# Patient Record
Sex: Female | Born: 2000 | Race: White | Hispanic: No | Marital: Single | Smoking: Never smoker
Health system: Southern US, Community
[De-identification: ages and names within clinical notes are randomized; demographics above are authoritative.]

## PROBLEM LIST (undated history)

## (undated) DIAGNOSIS — J45909 Unspecified asthma, uncomplicated: Secondary | ICD-10-CM

## (undated) DIAGNOSIS — I639 Cerebral infarction, unspecified: Secondary | ICD-10-CM

---
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
## 2022-01-11 ENCOUNTER — Encounter: Payer: Self-pay | Admitting: Occupational Therapy

## 2022-01-11 ENCOUNTER — Ambulatory Visit: Payer: Medicaid Other | Attending: Pediatrics | Admitting: Physical Therapy

## 2022-01-11 ENCOUNTER — Ambulatory Visit: Payer: Medicaid Other | Admitting: Occupational Therapy

## 2022-01-11 DIAGNOSIS — Z7409 Other reduced mobility: Secondary | ICD-10-CM | POA: Diagnosis present

## 2022-01-11 DIAGNOSIS — R2681 Unsteadiness on feet: Secondary | ICD-10-CM | POA: Insufficient documentation

## 2022-01-11 DIAGNOSIS — R41840 Attention and concentration deficit: Secondary | ICD-10-CM | POA: Diagnosis present

## 2022-01-11 DIAGNOSIS — R41844 Frontal lobe and executive function deficit: Secondary | ICD-10-CM | POA: Diagnosis present

## 2022-01-11 DIAGNOSIS — M25611 Stiffness of right shoulder, not elsewhere classified: Secondary | ICD-10-CM | POA: Diagnosis present

## 2022-01-11 DIAGNOSIS — M6281 Muscle weakness (generalized): Secondary | ICD-10-CM | POA: Insufficient documentation

## 2022-01-11 DIAGNOSIS — M25631 Stiffness of right wrist, not elsewhere classified: Secondary | ICD-10-CM | POA: Diagnosis present

## 2022-01-11 DIAGNOSIS — I69151 Hemiplegia and hemiparesis following nontraumatic intracerebral hemorrhage affecting right dominant side: Secondary | ICD-10-CM

## 2022-01-11 NOTE — Patient Instructions (Signed)
SHOULDER: Flexion On Table    Place hands on table, elbows straight. Move hips away from body. Slide towel forward Press hands down into table. Hold __5_ seconds. _10__ reps per set, __1_ sets per day, _7__ days per week, or you can clasp hands and reach for floor in sitting  MP Extension (Passive)    Straighten knuckles of ___all___ fingers using other hand gently. Hold __5__ seconds. Repeat __10__ times. Do _2-3___ sessions per day.  Copyright  VHI. All rights reserved.   Copyright  VHI. All rights reserved.  Composite Extension (Passive)    Using other hand, straighten thumb completely at both joints. Hold _5___ seconds. Repeat __10__ times. Do __2__ sessions per day. Perform gently do not over stretch  Copyright  VHI. All rights reserved.   AROM: Elbow Flexion / Extension    With right hand palm up, gently bend elbow as far as possible assisting with other hand. Then straighten arm as far as possible. Repeat ___10_ times per set. Do ___1_ sets per session. Do __3__ sessions per day.  Copyright  VHI. All rights reserved.  Supination (Passive)    Keep elbow bent at right angle and held firmly at side. Use other hand to turn forearm until palm faces upward. Hold __5__ seconds. Repeat ___10_ times. Do ___2_ sessions per day.  Copyright  VHI. All rights reserved.

## 2022-01-11 NOTE — Therapy (Signed)
OUTPATIENT OCCUPATIONAL THERAPY NEURO EVALUATION  Patient Name: Savannah [name] MRN: [id number] DOB:02/10/2001, 21 y.o., female Today's Date: 01/13/2022  PCP: Dr. Everlene Other REFERRING PROVIDER: Dr. Artis Flock   OT End of Session - 01/13/22 1621     Number of Visits 1    Authorization Type Pediatric Traditional Medicaid    OT Start Time 0935    OT Stop Time 1015    OT Time Calculation (min) 40 min    Activity Tolerance Patient tolerated treatment well    Behavior During Therapy [facility] for tasks assessed/performed              Past Medical History:  Diagnosis Date   Asthma    Stroke [facility])    Past Surgical History:  Procedure Laterality Date   BRAIN SURGERY  02/20/2016   Patient Active Problem List   Diagnosis Date Noted   MDD (major depressive disorder), recurrent severe, without psychosis (HCC) 08/04/2019   Suicide ideation 08/04/2019   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 (HCC) 02/26/2018   Adjustment disorder 02/26/2018   Spasticity 04/15/2017   Impaired functional mobility, balance, gait, and endurance 07/08/2016   AVM (arteriovenous malformation) brain 05/27/2016   Expressive aphasia 03/26/2016   Hydrocephalus, acquired (HCC) 03/07/2016   Neurocognitive deficits 03/07/2016   Intraparenchymal hematoma of brain (HCC) 02/20/2016   Hypertension 02/20/2016   Bradycardia 02/20/2016   Midline shift of brain due to hematoma (HCC) 02/20/2016    ONSET DATE:12/07/21 REFERRING DIAG:  Diagnosis I69.151 (ICD-10-CM) - 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 (HCC)     THERAPY DIAG:  Muscle weakness (generalized) - Plan: Ot plan of care cert/re-cert  Unsteadiness on feet - Plan: Ot plan of care cert/re-cert  Hemiplegia and hemiparesis following nontraumatic intracerebral hemorrhage affecting right dominant side (HCC) - Plan: Ot plan of care cert/re-cert  Stiffness  of right wrist, not elsewhere classified - Plan: Ot plan of care cert/re-cert  Stiffness of right shoulder, not elsewhere classified - Plan: Ot plan of care cert/re-cert  Attention and concentration deficit - Plan: Ot plan of care cert/re-cert  Frontal lobe and executive function deficit - Plan: Ot plan of care cert/re-cert  Rationale for Evaluation and Treatment: Rehabilitation  SUBJECTIVE:   SUBJECTIVE STATEMENT: Pt reports hand is doing fine Pt accompanied by:  grandmother  PERTINENT HISTORY:Astria Mikel Pyon is a 21 y.o. 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 Yes: NPRS scale: 8/10 Pain location: R foot Pain description: aching Aggravating factors: walking Relieving factors: rest  FALLS: Has patient fallen in last 6 months? Yes. Number of falls 2  LIVING ENVIRONMENT: Lives with: lives on campus, handicapp apt.   PLOF: Independent with basic ADLs and Independent with community mobility without device  PATIENT GOALS: review HEP  OBJECTIVE:   HAND DOMINANCE: Left  ADLs: Overall ADLs: modified I with all basic ADLS Transfers/ambulation related to ADLs:   IADLs: Shopping: needs assist with transportation  Meal Prep: eats at cafeteria  Handwriting:  writes with left hand  MOBILITY STATUS: Independent and Hx of falls       UPPER EXTREMITY ROM:    Active ROM Right eval Left eval  Shoulder flexion 30*   Shoulder abduction    Shoulder adduction    Shoulder extension  Shoulder internal rotation    Shoulder external rotation    Elbow flexion 75*   Elbow extension full   Wrist flexion unable   Wrist extension unable   Wrist ulnar deviation    Wrist radial deviation    Wrist pronation WFLS from neutral   Wrist supination unable   P/ROM  at least 90* shoulder flexion, 120 P/ROM elbow flexion  P/ROM wrist flexion  WFLS, extension 75% finger due to spasticity Mantains fingers in flexion and thumb adducted at rest, able to achieve full passive finger ext.   HAND FUNCTION: No active movement R hand  SENSATION: Light touch: Impaired     MUSCLE TONE: RUE: Hypertonic  COGNITION: Overall cognitive status: Within functional limits for tasks assessed       TODAY'S TREATMENT:                                                                                                                              DATE: reviewed HEP for gentle stretching   PATIENT EDUCATION: Education details: RUE stretches- see pt instructions Person educated: Patient and Parent Education method: Explanation, Demonstration, Verbal cues, and Handouts Education comprehension: verbalized understanding, returned demonstration, and verbal cues required  HOME EXERCISE PROGRAM: RUE stretching   GOALS: n/a 1 x visit- no goals set as education was completed on day of evalution.  CLINICAL IMPRESSION: Patient is a  21 y.o. 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 Pt. 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 Pt was seen today for evalution and review of self ROM stretches. Pt has a resting hand splint which she did not bring with her but she reports that it still fits. Pt is attending college and she resides in a handicap accessible apt.  Marland Kitchen   PERFORMANCE DEFICITS: in functional skills including ADLs, IADLs, coordination, dexterity, sensation, tone, ROM, strength, flexibility, Fine motor control, Gross motor control,  mobility, balance, and UE functional use,   IMPAIRMENTS: are limiting patient from ADLs, IADLs, education, work, play, leisure, and social participation.   CO-MORBIDITIES: may have co-morbidities  that affects occupational performance. Patient will benefit from skilled OT to address above impairments and improve overall function.  MODIFICATION OR ASSISTANCE TO COMPLETE EVALUATION: No modification of tasks or assist necessary to complete an evaluation.  OT OCCUPATIONAL PROFILE AND HISTORY: Detailed assessment: Review of records and additional review of physical, cognitive, psychosocial history related to current functional performance.  CLINICAL DECISION MAKING: LOW - limited treatment options, no task modification necessary  REHAB POTENTIAL: Good  EVALUATION COMPLEXITY: Low    PLAN:  OT FREQUENCY: one time visit  OT DURATION: 8 weeks  PLANNED INTERVENTIONS:  self care/ADL training, neuromuscular re-education, passive range of motion, and patient/family education  RECOMMENDED OTHER SERVICES: PT  CONSULTED AND AGREED WITH PLAN OF CARE: Patient and family member/caregiver  PLAN FOR NEXT SESSION: d/c OT, 1x visit for eval and review of self ROM, per patient request. Pt reports UE is doing well.   Keigen Caddell, OT 01/13/2022, 4:22 PM

## 2022-01-11 NOTE — Therapy (Signed)
OUTPATIENT PHYSICAL THERAPY NEURO EVALUATION   Patient Name: Savannah [name] MRN: [id number] DOB:2001-01-22, 21 y.o., female Today's Date: 01/11/2022   PCP: Tracey Harries, MD REFERRING PROVIDER: Margurite Auerbach, MD   PT End of Session - 01/11/22 1019     Visit Number 1    Number of Visits 5   with eval   Date for PT Re-Evaluation 03/08/22   to allow for scheduling conflicts and time to get new AFO   Authorization Type Medicaid    Authorization Time Period --    Authorization - Visit Number 1    PT Start Time 1017    PT Stop Time 1058    PT Time Calculation (min) 41 min    Equipment Utilized During Treatment --    Activity Tolerance Patient tolerated treatment well    Behavior During Therapy [facility] for tasks assessed/performed             Past Medical History:  Diagnosis Date   Asthma    Stroke [facility])    Past Surgical History:  Procedure Laterality Date   BRAIN SURGERY  02/20/2016   Patient Active Problem List   Diagnosis Date Noted   MDD (major depressive disorder), recurrent severe, without psychosis (HCC) 08/04/2019   Suicide ideation 08/04/2019   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 (HCC) 02/26/2018   Adjustment disorder 02/26/2018   Spasticity 04/15/2017   Impaired functional mobility, balance, gait, and endurance 07/08/2016   AVM (arteriovenous malformation) brain 05/27/2016   Expressive aphasia 03/26/2016   Hydrocephalus, acquired (HCC) 03/07/2016   Neurocognitive deficits 03/07/2016   Intraparenchymal hematoma of brain (HCC) 02/20/2016   Hypertension 02/20/2016   Bradycardia 02/20/2016   Midline shift of brain due to hematoma (HCC) 02/20/2016    ONSET DATE: 12/07/2021  REFERRING DIAG: B35.329 (ICD-10-CM) - 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 (HCC)  THERAPY DIAG:  Muscle weakness (generalized)  Unsteadiness on feet  Impaired  functional mobility, balance, gait, and endurance  Rationale for Evaluation and Treatment: Rehabilitation  SUBJECTIVE:                                                                                                                                                                                             SUBJECTIVE STATEMENT: "[name]"  Pt reports that since starting college she has been walking a lot more and has noticed she is struggling more with her legs and like her R knee might give out at times. Pt reports her current RLE AFO is painful when she is walking and she has to  remove it every time she sits down due to the pain. Pt reports she feels the pain from the lateral aspect of her AFO pressing against her ankle and feels like her foot might be turning in when walking and her toes might be curling. Pt also reports when she firsts gets up in the morning she is stiff and she has to take a few steps before her leg loosens up. Pt has an appointment with [facility] at [facility] to receive her new AFO 11/27. Pt reports she is on Baclofen and she has been getting Botox shots (most recently in Oct of this year) but she hasn't noticed much change since she got them.   Pt accompanied by: family member grandma [name]  PERTINENT HISTORY:  history of AVM causing ICH leading to craniotomy and then secondary infection s/p cranioplasty, with resulting right hemiplegia; asthma  PAIN:  Are you having pain? No and Yes: NPRS scale: 8-9/10 Pain location: R foot Pain description: sharp Aggravating factors: walking while wearing brace Relieving factors: taking off brace  PRECAUTIONS: Fall  WEIGHT BEARING RESTRICTIONS: No  FALLS: Has patient fallen in last 6 months? Yes. Number of falls 2; no injuries  LIVING ENVIRONMENT: Lives with:  lives on college campus Lives in: Other dormitory Stairs: No; can access what she needs to on campus via ramps, doesn't have to use stairs Has following equipment at home:  None  PLOF: Independent with gait and Independent with transfers  PATIENT GOALS: "find out what is going on with my leg, fix it, and not be in pain constantly"  OBJECTIVE:   DIAGNOSTIC FINDINGS:  Brain MRI 11/15/21 IMPRESSION: No evidence of recent infarction, hemorrhage, mass, or abnormal enhancement.   Sequelae of prior cerebral hemorrhage.  COGNITION: Overall cognitive status: Within functional limits for tasks assessed   MUSCLE TONE: RLE: Within functional limits  LOWER EXTREMITY ROM:     Passive  Right Eval Left Eval  Hip flexion    Hip extension    Hip abduction    Hip adduction    Hip internal rotation    Hip external rotation    Knee flexion    Knee extension    Ankle dorsiflexion    Ankle plantarflexion PF contracture   Ankle inversion    Ankle eversion     (Blank rows = not tested)  LOWER EXTREMITY MMT:    MMT Right Eval Left Eval  Hip flexion 5 5  Hip extension    Hip abduction    Hip adduction    Hip internal rotation    Hip external rotation    Knee flexion 5 5  Knee extension 5 5  Ankle dorsiflexion 0 5  Ankle plantarflexion    Ankle inversion    Ankle eversion    (Blank rows = not tested)  BED MOBILITY:  WFL  TRANSFERS: Assistive device utilized: None  Sit to stand: Modified independence Stand to sit: Modified independence Chair to chair: Modified independence Floor:  not assessed at eval  STAIRS: Level of Assistance: Modified independence Stair Negotiation Technique: Alternating Pattern  with Single Rail on Right Number of Stairs: 4  Height of Stairs: 6  Comments: decreased control with RLE when descending  GAIT: Gait pattern: decreased step length- Right, decreased hip/knee flexion- Right, decreased ankle dorsiflexion- Right, circumduction- Right, and genu recurvatum- Right Distance walked: 115 ft Assistive device utilized: None Level of assistance: Modified independence Comments: increased time needed  FUNCTIONAL  TESTS:    [facility] PT Assessment - 01/11/22  1033       Ambulation/Gait   Gait velocity 32.8 ft over 11.75 sec = 2.79 ft/sec      Standardized Balance Assessment   Standardized Balance Assessment Five Times Sit to Stand;Timed Up and Go Test    Five times sit to stand comments  24.53 sec   no UE support, wt shift to the L     Timed Up and Go Test   TUG Normal TUG    Normal TUG (seconds) 17.09   no AD     Functional Gait  Assessment   Gait assessed  Yes    Gait Level Surface Walks 20 ft in less than 7 sec but greater than 5.5 sec, uses assistive device, slower speed, mild gait deviations, or deviates 6-10 in outside of the 12 in walkway width.    Change in Gait Speed Able to change speed, demonstrates mild gait deviations, deviates 6-10 in outside of the 12 in walkway width, or no gait deviations, unable to achieve a major change in velocity, or uses a change in velocity, or uses an assistive device.    Gait with Horizontal Head Turns Performs head turns smoothly with no change in gait. Deviates no more than 6 in outside 12 in walkway width    Gait with Vertical Head Turns Performs head turns with no change in gait. Deviates no more than 6 in outside 12 in walkway width.    Gait and Pivot Turn Pivot turns safely within 3 sec and stops quickly with no loss of balance.    Step Over Obstacle Cannot perform without assistance.    Gait with Narrow Base of Support Ambulates 4-7 steps.    Gait with Eyes Closed Walks 20 ft, no assistive devices, good speed, no evidence of imbalance, normal gait pattern, deviates no more than 6 in outside 12 in walkway width. Ambulates 20 ft in less than 7 sec.    Ambulating Backwards Walks 20 ft, no assistive devices, good speed, no evidence for imbalance, normal gait    Steps Alternating feet, must use rail.    Total Score 22    FGA comment: 22/30, medium fall risk             TODAY'S TREATMENT:                                                                                                                                PT Evaluation    PATIENT EDUCATION: Education details: Eval findings, PT POC, initiated HEP Person educated: Patient and grandma Engineer, civil (consulting) Education method: Explanation, Demonstration, and Handouts Education comprehension: verbalized understanding and returned demonstration  HOME EXERCISE PROGRAM: Access Code: [email or web address] URL: [email or web address] Date: 01/11/2022 Prepared by: Peter Congo  Exercises - Seated Plantar Fascia Stretch  - 1 x daily - 7 x weekly - 1 sets - 10 reps - 30 sec hold - Seated Ankle Inversion Eversion PROM  - 1 x daily - 7  x weekly - 1 sets - 10 reps - 30 sec hold  GOALS: Goals reviewed with patient? Yes  SHORT TERM GOALS: Target date: 02/08/2022  Pt will be independent with initial HEP for improved strength, balance, transfers and gait. Baseline: initiated at eval Goal status: INITIAL  LONG TERM GOALS: Target date: 03/08/2022  Pt will be independent with final HEP for improved strength, balance, transfers and gait. Baseline:  Goal status: INITIAL  2.  Pt will improve gait velocity to at least 3.0 ft/sec for improved gait efficiency and performance at mod I level  Baseline: 2.79 ft/sec (11/10) Goal status: INITIAL  3.  Pt will improve normal TUG to less than or equal to 13 seconds for improved functional mobility and decreased fall risk. Baseline: 17.09 sec (11/10) Goal status: INITIAL  4.  Pt will improve 5 x STS to less than or equal to 20 seconds to demonstrate improved functional strength and transfer efficiency.  Baseline: 24.53 sec (11/10) Goal status: INITIAL  5.  Pt will improve FGA to 27/30 for decreased fall risk  Baseline: 22/30 Goal status: INITIAL   ASSESSMENT:  CLINICAL IMPRESSION: Patient is a 21 year old female referred to Neuro OPPT for R hemiparesis secondary to ICH.   Pt's PMH is significant for: history of AVM causing ICH leading to craniotomy and then  secondary infection s/p cranioplasty, with resulting right hemiplegia and asthma. The following deficits were present during the exam: decreased RLE strength, decreased balance, decreased gait speed, RLE gait impairments, and increased pain. Based on her gait speed of 2.79 ft/sec, TUG score of 17.09 sec, 5xSTS score of 24.53 sec, and FGA score of 22/30 as well as history of falls, pt is an increased risk for falls. Pt would benefit from skilled PT to address these impairments and functional limitations to maximize functional mobility independence.  OBJECTIVE IMPAIRMENTS: Abnormal gait, decreased balance, decreased coordination, decreased mobility, difficulty walking, decreased ROM, decreased strength, impaired perceived functional ability, increased muscle spasms, impaired flexibility, impaired sensation, impaired UE functional use, and pain.   ACTIVITY LIMITATIONS: carrying, lifting, bending, and stairs  PARTICIPATION LIMITATIONS: community activity and school  PERSONAL FACTORS: Time since onset of injury/illness/exacerbation and 1-2 comorbidities:    history of AVM causing ICH leading to craniotomy and then secondary infection s/p cranioplasty, with resulting right hemiplegia and asthma are also affecting patient's functional outcome.   REHAB POTENTIAL: Good  CLINICAL DECISION MAKING: Stable/uncomplicated  EVALUATION COMPLEXITY: Low  PLAN:  PT FREQUENCY: 1x/week  PT DURATION:  5 sessions  PLANNED INTERVENTIONS: Therapeutic exercises, Therapeutic activity, Neuromuscular re-education, Balance training, Gait training, Patient/Family education, Self Care, Joint mobilization, Stair training, Orthotic/Fit training, DME instructions, Dry Needling, Electrical stimulation, Cryotherapy, Moist heat, Taping, Manual therapy, and Re-evaluation  PLAN FOR NEXT SESSION: did pt get new AFO from [facility]? Reassess gait with new AFO; add in balance exercises for HEP as well as RLE strengthening and ROM  Check  all possible CPT codes: 10272 - PT Re-evaluation, 97110- Therapeutic Exercise, [phone]- Neuro Re-education, [phone] - Gait Training, [phone] - Manual Therapy, 97530 - Therapeutic Activities, [phone] - Electrical stimulation (Manual), [phone] - Orthotic Fit, and [phone] - Physical performance training    Check all conditions that are expected to impact treatment: Neurological condition   If treatment provided at initial evaluation, no treatment charged due to lack of authorization.     Peter Congo, PT, DPT, CSRS 01/11/2022, 12:21 PM

## 2022-01-13 NOTE — Addendum Note (Signed)
Addended by: Keene Breath B on: 01/13/2022 04:23 PM   Modules accepted: Orders

## 2022-01-23 ENCOUNTER — Encounter: Payer: Self-pay | Admitting: Physical Medicine and Rehabilitation

## 2022-01-30 ENCOUNTER — Ambulatory Visit: Payer: Medicaid Other | Admitting: Physical Therapy

## 2022-01-30 DIAGNOSIS — R2681 Unsteadiness on feet: Secondary | ICD-10-CM

## 2022-01-30 DIAGNOSIS — I69151 Hemiplegia and hemiparesis following nontraumatic intracerebral hemorrhage affecting right dominant side: Secondary | ICD-10-CM

## 2022-01-30 DIAGNOSIS — M6281 Muscle weakness (generalized): Secondary | ICD-10-CM | POA: Diagnosis not present

## 2022-01-30 NOTE — Therapy (Signed)
OUTPATIENT PHYSICAL THERAPY NEURO TREATMENT   Patient Name: Savannah [name] MRN: [id number] DOB:February 25, 2001, 21 y.o., female Today's Date: 01/30/2022   PCP: Bernerd Limbo, MD REFERRING PROVIDER: Rocky Link, MD   PT End of Session - 01/30/22 1150     Visit Number 2    Number of Visits 5   with eval   Date for PT Re-Evaluation 03/08/22   to allow for scheduling conflicts and time to get new AFO   Authorization Type Medicaid    PT Start Time 1147   pt late   PT Stop Time 1229    PT Time Calculation (min) 42 min    Activity Tolerance Patient tolerated treatment well    Behavior During Therapy [facility] for tasks assessed/performed              Past Medical History:  Diagnosis Date   Asthma    Stroke [facility])    Past Surgical History:  Procedure Laterality Date   BRAIN SURGERY  02/20/2016   Patient Active Problem List   Diagnosis Date Noted   MDD (major depressive disorder), recurrent severe, without psychosis ([place]) 08/04/2019   Suicide ideation 08/04/2019   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 ([place]) 02/26/2018   Adjustment disorder 02/26/2018   Spasticity 04/15/2017   Impaired functional mobility, balance, gait, and endurance 07/08/2016   AVM (arteriovenous malformation) brain 05/27/2016   Expressive aphasia 03/26/2016   Hydrocephalus, acquired ([place]) 03/07/2016   Neurocognitive deficits 03/07/2016   Intraparenchymal hematoma of brain ([place]) 02/20/2016   Hypertension 02/20/2016   Bradycardia 02/20/2016   Midline shift of brain due to hematoma ([place]) 02/20/2016    ONSET DATE: 12/07/2021  REFERRING DIAG: [id number] (ICD-10-CM) - 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 (HCC)  THERAPY DIAG:  Muscle weakness (generalized)  Unsteadiness on feet  Hemiplegia and hemiparesis following nontraumatic intracerebral hemorrhage affecting right dominant side  ([place])  Rationale for Evaluation and Treatment: Rehabilitation  SUBJECTIVE:                                                                                                                                                                                             SUBJECTIVE STATEMENT: "Savannah [name]"  Pt reports she is wearing new shoes today (same brand she has worn before) and it is causing B foot pain (R more so than L). Otherwise no acute changes since last session. Pt reports that she gets her new AFO on Friday. Pt states that HEP/stretches given last visit have been helpful.   Pt accompanied by: self  PERTINENT HISTORY:  history of AVM causing ICH leading to craniotomy and then secondary infection s/p cranioplasty, with resulting right hemiplegia; asthma  PAIN:  Are you having pain? No and Yes: NPRS scale: 8-9/10 Pain location: R foot Pain description: sharp Aggravating factors: walking while wearing brace Relieving factors: taking off brace  PRECAUTIONS: Fall  WEIGHT BEARING RESTRICTIONS: No  FALLS: Has patient fallen in last 6 months? Yes. Number of falls 2; no injuries  LIVING ENVIRONMENT: Lives with:  lives on college campus Lives in: Other dormitory Stairs: No; can access what she needs to on campus via ramps, doesn't have to use stairs Has following equipment at home: None  PLOF: Independent with gait and Independent with transfers  PATIENT GOALS: "find out what is going on with my leg, fix it, and not be in pain constantly"  OBJECTIVE:   DIAGNOSTIC FINDINGS:  Brain MRI 11/15/21 IMPRESSION: No evidence of recent infarction, hemorrhage, mass, or abnormal enhancement.   Sequelae of prior cerebral hemorrhage.   TODAY'S TREATMENT:                                                                                                                               THER EX: Standing resisted TKE with RTB 3 x 15 reps Mini-squats 3 x 10 reps with mirror for visual feedback to  maintain midline  THER ACT: At countertop: Resisted sidesteps L/R with RTB 6 x 10 ft Resisted monster walks L/R with RTB 6 x 10 ft  At mat table: Staggered sit to stand with wedge under LLE 3 x 10 reps Use of mirror for visual feedback to maintain midline Min manual cues through RLE for increased WB, min manual cues at hips to increase weight shift onto RLE   PATIENT EDUCATION: Education details: added to HEP Person educated: Patient Education method: Consulting civil engineer, Media planner, and Handouts Education comprehension: verbalized understanding and returned demonstration  HOME EXERCISE PROGRAM: Access Code: [email or web address] URL: [email or web address] Date: 01/11/2022 Prepared by: Excell Seltzer  Exercises - Seated Plantar Fascia Stretch  - 1 x daily - 7 x weekly - 1 sets - 10 reps - 30 sec hold - Seated Ankle Inversion Eversion PROM  - 1 x daily - 7 x weekly - 1 sets - 10 reps - 30 sec hold - Standing Terminal Knee Extension with Resistance  - 1 x daily - 7 x weekly - 3 sets - 15 reps - Side Stepping with Resistance at Ankles  - 1 x daily - 7 x weekly - 1 sets - 5 reps - Forward Monster Walk with Resistance at Ankles and Counter Support  - 1 x daily - 7 x weekly - 1 sets - 5 reps   GOALS: Goals reviewed with patient? Yes  SHORT TERM GOALS: Target date: 02/08/2022  Pt will be independent with initial HEP for improved strength, balance, transfers and gait. Baseline: initiated at eval Goal status: INITIAL  LONG TERM GOALS: Target date: 03/08/2022  Pt will be  independent with final HEP for improved strength, balance, transfers and gait. Baseline:  Goal status: INITIAL  2.  Pt will improve gait velocity to at least 3.0 ft/sec for improved gait efficiency and performance at mod I level  Baseline: 2.79 ft/sec (11/10) Goal status: INITIAL  3.  Pt will improve normal TUG to less than or equal to 13 seconds for improved functional mobility and decreased fall risk. Baseline:  17.09 sec (11/10) Goal status: INITIAL  4.  Pt will improve 5 x STS to less than or equal to 20 seconds to demonstrate improved functional strength and transfer efficiency.  Baseline: 24.53 sec (11/10) Goal status: INITIAL  5.  Pt will improve FGA to 27/30 for decreased fall risk  Baseline: 22/30 Goal status: INITIAL   ASSESSMENT:  CLINICAL IMPRESSION: Emphasis of skilled PT session on working on improving RLE strength and control, increasing WB on RLE with sit to stand transfers and in standing, and adding to HEP. Pt exhibits decreased WB through RLE and increased weight shift to the L with sit to stands as compensation for RLE weakness. Pt exhibits good challenge level with exercises performed this session with visual and manual cues needed for correct form and to increase WB through RLE. Will assess gait with new AFO next session if pt has received it by then. Continue POC.   OBJECTIVE IMPAIRMENTS: Abnormal gait, decreased balance, decreased coordination, decreased mobility, difficulty walking, decreased ROM, decreased strength, impaired perceived functional ability, increased muscle spasms, impaired flexibility, impaired sensation, impaired UE functional use, and pain.   ACTIVITY LIMITATIONS: carrying, lifting, bending, and stairs  PARTICIPATION LIMITATIONS: community activity and school  PERSONAL FACTORS: Time since onset of injury/illness/exacerbation and 1-2 comorbidities:    history of AVM causing ICH leading to craniotomy and then secondary infection s/p cranioplasty, with resulting right hemiplegia and asthma are also affecting patient's functional outcome.   REHAB POTENTIAL: Good  CLINICAL DECISION MAKING: Stable/uncomplicated  EVALUATION COMPLEXITY: Low  PLAN:  PT FREQUENCY: 1x/week  PT DURATION:  5 sessions  PLANNED INTERVENTIONS: Therapeutic exercises, Therapeutic activity, Neuromuscular re-education, Balance training, Gait training, Patient/Family education, Self  Care, Joint mobilization, Stair training, Orthotic/Fit training, DME instructions, Dry Needling, Electrical stimulation, Cryotherapy, Moist heat, Taping, Manual therapy, and Re-evaluation  PLAN FOR NEXT SESSION: did pt get new AFO from [facility]? Reassess gait with new AFO; add in balance exercises for HEP as well as RLE strengthening and ROM, eccentric step downs, stance on RLE with LLE taps, resisted dot taps, 4 square stepping?  Check all possible CPT codes: 56213 - PT Re-evaluation, 97110- Therapeutic Exercise, [phone]- Neuro Re-education, [phone] - Gait Training, [phone] - Manual Therapy, [phone] - Therapeutic Activities, [phone] - Electrical stimulation (Manual), [phone] - Orthotic Fit, and [phone] - Physical performance training    Check all conditions that are expected to impact treatment: Neurological condition   If treatment provided at initial evaluation, no treatment charged due to lack of authorization.     Peter Congo, PT, DPT, CSRS 01/30/2022, 12:30 PM

## 2022-02-06 ENCOUNTER — Ambulatory Visit: Payer: Medicaid Other | Attending: Pediatrics | Admitting: Physical Therapy

## 2022-02-06 DIAGNOSIS — M6281 Muscle weakness (generalized): Secondary | ICD-10-CM | POA: Diagnosis present

## 2022-02-06 DIAGNOSIS — Z7409 Other reduced mobility: Secondary | ICD-10-CM | POA: Diagnosis present

## 2022-02-06 DIAGNOSIS — R2681 Unsteadiness on feet: Secondary | ICD-10-CM | POA: Diagnosis present

## 2022-02-06 DIAGNOSIS — I69151 Hemiplegia and hemiparesis following nontraumatic intracerebral hemorrhage affecting right dominant side: Secondary | ICD-10-CM

## 2022-02-06 NOTE — Therapy (Signed)
OUTPATIENT PHYSICAL THERAPY NEURO TREATMENT   Patient Name: Savannah [name] MRN: [id number] DOB:April 01, 2000, 21 y.o., female Today's Date: 02/06/2022   PCP: Tracey Harries, MD REFERRING PROVIDER: Margurite Auerbach, MD   PT End of Session - 02/06/22 1151     Visit Number 3    Number of Visits 5   with eval   Date for PT Re-Evaluation 03/08/22   to allow for scheduling conflicts and time to get new AFO   Authorization Type Medicaid    PT Start Time 1148   therapist ran over with previous patient   PT Stop Time 1230    PT Time Calculation (min) 42 min    Equipment Utilized During Treatment Gait belt    Activity Tolerance Patient tolerated treatment well    Behavior During Therapy [facility] for tasks assessed/performed               Past Medical History:  Diagnosis Date   Asthma    Stroke [facility])    Past Surgical History:  Procedure Laterality Date   BRAIN SURGERY  02/20/2016   Patient Active Problem List   Diagnosis Date Noted   MDD (major depressive disorder), recurrent severe, without psychosis (HCC) 08/04/2019   Suicide ideation 08/04/2019   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 (HCC) 02/26/2018   Adjustment disorder 02/26/2018   Spasticity 04/15/2017   Impaired functional mobility, balance, gait, and endurance 07/08/2016   AVM (arteriovenous malformation) brain 05/27/2016   Expressive aphasia 03/26/2016   Hydrocephalus, acquired (HCC) 03/07/2016   Neurocognitive deficits 03/07/2016   Intraparenchymal hematoma of brain (HCC) 02/20/2016   Hypertension 02/20/2016   Bradycardia 02/20/2016   Midline shift of brain due to hematoma (HCC) 02/20/2016    ONSET DATE: 12/07/2021  REFERRING DIAG: T24.580 (ICD-10-CM) - 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 (HCC)  THERAPY DIAG:  Muscle weakness (generalized)  Unsteadiness on feet  Hemiplegia and hemiparesis  following nontraumatic intracerebral hemorrhage affecting right dominant side (HCC)  Impaired functional mobility, balance, gait, and endurance  Rationale for Evaluation and Treatment: Rehabilitation  SUBJECTIVE:                                                                                                                                                                                             SUBJECTIVE STATEMENT: "[name]"  Pt reports no falls or acute changes since last session. Pt's grandma reports that pt's new AFO is not in yet, shipped from [name] and [employer] will call once it arrives to schedule an appointment. Pt reports she isn't having  increased pain from her new shoes anymore, pain has returned to normal level which is caused by her current AFO. Pt reports her HEP has been going well, has good space in her dorm to work on exercises.   Pt accompanied by: self and family member grandma [name]  PERTINENT HISTORY:  history of AVM causing ICH leading to craniotomy and then secondary infection s/p cranioplasty, with resulting right hemiplegia; asthma  PAIN:  Are you having pain? No and Yes: NPRS scale: 4/10 Pain location: R foot Pain description: sharp Aggravating factors: walking while wearing brace Relieving factors: taking off brace  PRECAUTIONS: Fall  WEIGHT BEARING RESTRICTIONS: No  FALLS: Has patient fallen in last 6 months? Yes. Number of falls 2; no injuries  LIVING ENVIRONMENT: Lives with:  lives on college campus Lives in: Other dormitory Stairs: No; can access what she needs to on campus via ramps, doesn't have to use stairs Has following equipment at home: None  PLOF: Independent with gait and Independent with transfers  PATIENT GOALS: "find out what is going on with my leg, fix it, and not be in pain constantly"  OBJECTIVE:   DIAGNOSTIC FINDINGS:  Brain MRI 11/15/21 IMPRESSION: No evidence of recent infarction, hemorrhage, mass, or  abnormal enhancement.   Sequelae of prior cerebral hemorrhage.   TODAY'S TREATMENT:                                                                                                                              THER ACT: At mat table: Standing with 4" step under LLE performing ball toss with CGA for balance x 30 reps Blaze pods random one color dot taps with 5 pods Alt L/R taps x 3 rounds with CGA for balance 10, 10, 14 taps Alt L/R taps x 3 rounds with CGA, added in 4" step and 2" step under 2 pods 11, 15, 15 taps  NMR: At countertop: 4" eccentric step-downs (difficult) 2" eccentric step-downs with manual assist for knee control to prevent hyperextension 3 x 15 reps  PATIENT EDUCATION: Education details: continue HEP Person educated: Patient Education method: Medical illustrator Education comprehension: verbalized understanding and returned demonstration  HOME EXERCISE PROGRAM: Access Code: [email or web address] URL: [email or web address] Date: 01/11/2022 Prepared by: Peter Congo  Exercises - Seated Plantar Fascia Stretch  - 1 x daily - 7 x weekly - 1 sets - 10 reps - 30 sec hold - Seated Ankle Inversion Eversion PROM  - 1 x daily - 7 x weekly - 1 sets - 10 reps - 30 sec hold - Standing Terminal Knee Extension with Resistance  - 1 x daily - 7 x weekly - 3 sets - 15 reps - Side Stepping with Resistance at Ankles  - 1 x daily - 7 x weekly - 1 sets - 5 reps - Forward Monster Walk with Resistance at Ankles and Counter Support  - 1 x daily - 7 x weekly - 1 sets - 5 reps  GOALS: Goals reviewed with patient? Yes  SHORT TERM GOALS: Target date: 02/08/2022  Pt will be independent with initial HEP for improved strength, balance, transfers and gait. Baseline: initiated at eval Goal status: INITIAL  LONG TERM GOALS: Target date: 03/08/2022  Pt will be independent with final HEP for improved strength, balance, transfers and gait. Baseline:  Goal status:  INITIAL  2.  Pt will improve gait velocity to at least 3.0 ft/sec for improved gait efficiency and performance at mod I level  Baseline: 2.79 ft/sec (11/10) Goal status: INITIAL  3.  Pt will improve normal TUG to less than or equal to 13 seconds for improved functional mobility and decreased fall risk. Baseline: 17.09 sec (11/10) Goal status: INITIAL  4.  Pt will improve 5 x STS to less than or equal to 20 seconds to demonstrate improved functional strength and transfer efficiency.  Baseline: 24.53 sec (11/10) Goal status: INITIAL  5.  Pt will improve FGA to 27/30 for decreased fall risk  Baseline: 22/30 Goal status: INITIAL   ASSESSMENT:  CLINICAL IMPRESSION: Emphasis of skilled PT session on continuing to work on RLE NMR and SLS on RLE for improved stability. Pt continues to exhibit decreased R quad control with ongoing knee hyperextension as well as decreased balance with SLS on RLE. Pt continues to benefit from skilled therapy services to address ongoing R hemibody weakness, decreased balance, and to assess gait with new AFO once she receives it. Continue POC.   OBJECTIVE IMPAIRMENTS: Abnormal gait, decreased balance, decreased coordination, decreased mobility, difficulty walking, decreased ROM, decreased strength, impaired perceived functional ability, increased muscle spasms, impaired flexibility, impaired sensation, impaired UE functional use, and pain.   ACTIVITY LIMITATIONS: carrying, lifting, bending, and stairs  PARTICIPATION LIMITATIONS: community activity and school  PERSONAL FACTORS: Time since onset of injury/illness/exacerbation and 1-2 comorbidities:    history of AVM causing ICH leading to craniotomy and then secondary infection s/p cranioplasty, with resulting right hemiplegia and asthma are also affecting patient's functional outcome.   REHAB POTENTIAL: Good  CLINICAL DECISION MAKING: Stable/uncomplicated  EVALUATION COMPLEXITY: Low  PLAN:  PT FREQUENCY:  1x/week  PT DURATION:  5 sessions  PLANNED INTERVENTIONS: Therapeutic exercises, Therapeutic activity, Neuromuscular re-education, Balance training, Gait training, Patient/Family education, Self Care, Joint mobilization, Stair training, Orthotic/Fit training, DME instructions, Dry Needling, Electrical stimulation, Cryotherapy, Moist heat, Taping, Manual therapy, and Re-evaluation  PLAN FOR NEXT SESSION: did pt get new AFO from [employer]? Reassess gait with new AFO; add in balance exercises for HEP as well as RLE strengthening and ROM, eccentric step downs, stance on RLE with LLE taps with assist for knee control, resisted dot taps, 4 square stepping?, SLS with stool  Check all possible CPT codes: 03212 - PT Re-evaluation, 97110- Therapeutic Exercise, [phone]- Neuro Re-education, [phone] - Gait Training, [phone] - Manual Therapy, 97530 - Therapeutic Activities, [phone] - Electrical stimulation (Manual), [phone] - Orthotic Fit, and [id number] - Physical performance training    Check all conditions that are expected to impact treatment: Neurological condition   If treatment provided at initial evaluation, no treatment charged due to lack of authorization.     Peter Congo, PT, DPT, CSRS 02/06/2022, 12:30 PM

## 2022-02-13 ENCOUNTER — Ambulatory Visit: Payer: Medicaid Other | Admitting: Physical Therapy

## 2022-02-20 ENCOUNTER — Ambulatory Visit: Payer: Medicaid Other | Admitting: Physical Therapy

## 2022-02-20 DIAGNOSIS — Z7409 Other reduced mobility: Secondary | ICD-10-CM

## 2022-02-20 DIAGNOSIS — M6281 Muscle weakness (generalized): Secondary | ICD-10-CM

## 2022-02-20 DIAGNOSIS — R2681 Unsteadiness on feet: Secondary | ICD-10-CM

## 2022-02-20 DIAGNOSIS — I69151 Hemiplegia and hemiparesis following nontraumatic intracerebral hemorrhage affecting right dominant side: Secondary | ICD-10-CM

## 2022-02-20 NOTE — Therapy (Signed)
OUTPATIENT PHYSICAL THERAPY NEURO TREATMENT   Patient Name: Savannah [name] MRN: [id number] DOB:2000/04/28, 21 y.o., female Today's Date: 02/20/2022   PCP: Bernerd Limbo, MD REFERRING PROVIDER: Rocky Link, MD   PT End of Session - 02/20/22 1148     Visit Number 4    Number of Visits 5   with eval   Date for PT Re-Evaluation 03/08/22   to allow for scheduling conflicts and time to get new AFO   Authorization Type Medicaid    PT Start Time 4742    PT Stop Time 1228    PT Time Calculation (min) 40 min    Equipment Utilized During Treatment Gait belt    Activity Tolerance Patient tolerated treatment well    Behavior During Therapy [facility] for tasks assessed/performed                Past Medical History:  Diagnosis Date   Asthma    Stroke [facility])    Past Surgical History:  Procedure Laterality Date   BRAIN SURGERY  02/20/2016   Patient Active Problem List   Diagnosis Date Noted   MDD (major depressive disorder), recurrent severe, without psychosis ([place]) 08/04/2019   Suicide ideation 08/04/2019   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 ([place]) 02/26/2018   Adjustment disorder 02/26/2018   Spasticity 04/15/2017   Impaired functional mobility, balance, gait, and endurance 07/08/2016   AVM (arteriovenous malformation) brain 05/27/2016   Expressive aphasia 03/26/2016   Hydrocephalus, acquired ([place]) 03/07/2016   Neurocognitive deficits 03/07/2016   Intraparenchymal hematoma of brain ([place]) 02/20/2016   Hypertension 02/20/2016   Bradycardia 02/20/2016   Midline shift of brain due to hematoma ([place]) 02/20/2016    ONSET DATE: 12/07/2021  REFERRING DIAG: V95.638 (ICD-10-CM) - 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 (HCC)  THERAPY DIAG:  Muscle weakness (generalized)  Unsteadiness on feet  Hemiplegia and hemiparesis following nontraumatic intracerebral  hemorrhage affecting right dominant side (HCC)  Impaired functional mobility, balance, gait, and endurance  Rationale for Evaluation and Treatment: Rehabilitation  SUBJECTIVE:                                                                                                                                                                                             SUBJECTIVE STATEMENT: "[name]"  Pt enters clinic with her new RLE AFO as well as L shoe that Brooke Pace at [employer] adjusted (added height to shoe) as pt was walking on her R toes. Pt has only worn her new AFO with her lifted L shoe a few times since  receiving them last Friday (12/15), unsure how comfortable she is with the changes just yet. Pt reports her pain has decreased with use of her new R AFO.   Pt accompanied by: self and family member grandma [name]  PERTINENT HISTORY:  history of AVM causing ICH leading to craniotomy and then secondary infection s/p cranioplasty, with resulting right hemiplegia; asthma  PAIN:  Are you having pain? No and Yes: NPRS scale: 4/10 Pain location: R foot Pain description: sharp Aggravating factors: walking while wearing brace Relieving factors: taking off brace  PRECAUTIONS: Fall  WEIGHT BEARING RESTRICTIONS: No  FALLS: Has patient fallen in last 6 months? Yes. Number of falls 2; no injuries  LIVING ENVIRONMENT: Lives with:  lives on college campus Lives in: Other dormitory Stairs: No; can access what she needs to on campus via ramps, doesn't have to use stairs Has following equipment at home: None  PLOF: Independent with gait and Independent with transfers  PATIENT GOALS: "find out what is going on with my leg, fix it, and not be in pain constantly"  OBJECTIVE:   DIAGNOSTIC FINDINGS:  Brain MRI 11/15/21 IMPRESSION: No evidence of recent infarction, hemorrhage, mass, or abnormal enhancement.   Sequelae of prior cerebral hemorrhage.   TODAY'S TREATMENT:                                                                                                                               THER ACT: To initiate goal assessment:  [facility] PT Assessment - 02/20/22 1229       Ambulation/Gait   Gait velocity 32.8 ft over 12 sec = 2.73 ft/sec      Timed Up and Go Test   TUG Normal TUG    Normal TUG (seconds) 13.66   no AD           GAIT:  Gait pattern: R hip drop noted with stance on LLE, decreased stance time- Right, and decreased hip/knee flexion- Right Distance walked: 115 ft Assistive device utilized: None Level of assistance: Modified independence Comments: with lifted L shoe, slightly decreased R knee hyperextension noted but also R hip drop with stance on LLE  Gait pattern: decreased stance time- Right and decreased hip/knee flexion- Right Distance walked: 115 ft Assistive device utilized: None Level of assistance: Modified independence Comments: with unlifted shoe, decreased deviations noted as compared to with L lifted shoe    THER EX: Seated gastroc stretch 4 x 30 sec each with use of sheet Staggered sit to stand 3 x 10 reps each  Tried squats but loses balance posteriorly and unable to keep R heel planted on the floor  Added stretch and sit to stands to HEP, see bolded below   PATIENT EDUCATION: Education details: continue HEP, added to HEP Person educated: Patient Education method: Consulting civil engineer, Demonstration, and Handouts Education comprehension: verbalized understanding and returned demonstration  HOME EXERCISE PROGRAM: Access Code: [email or web address] URL: [email or web address] Date: 01/11/2022 Prepared by: Excell Seltzer  Exercises - Seated Plantar Fascia Stretch  - 1 x daily - 7 x weekly - 1 sets - 10 reps - 30 sec hold - Seated Ankle Inversion Eversion PROM  - 1 x daily - 7 x weekly - 1 sets - 10 reps - 30 sec hold - Standing Terminal Knee Extension with Resistance  - 1 x daily - 7 x weekly - 3 sets - 15 reps - Side Stepping with  Resistance at Ankles  - 1 x daily - 7 x weekly - 1 sets - 5 reps - Forward Monster Walk with Resistance at Ankles and Counter Support  - 1 x daily - 7 x weekly - 1 sets - 5 reps - Long Sitting Calf Stretch with Strap  - 1 x daily - 7 x weekly - 2 sets - 5 reps - 30-60 sec hold - Staggered Sit-to-Stand  - 1 x daily - 7 x weekly - 3 sets - 10 reps   GOALS: Goals reviewed with patient? Yes  SHORT TERM GOALS: Target date: 02/08/2022  Pt will be independent with initial HEP for improved strength, balance, transfers and gait. Baseline: initiated at eval Goal status: MET  LONG TERM GOALS: Target date: 03/08/2022  Pt will be independent with final HEP for improved strength, balance, transfers and gait. Baseline:  Goal status: INITIAL  2.  Pt will improve gait velocity to at least 3.0 ft/sec for improved gait efficiency and performance at mod I level  Baseline: 2.79 ft/sec (11/10), 2.73 ft/sec (12/20) Goal status: IN PROGRESS  3.  Pt will improve normal TUG to less than or equal to 13 seconds for improved functional mobility and decreased fall risk. Baseline: 17.09 sec (11/10), 13.66 sec (12/20) Goal status: MET  4.  Pt will improve 5 x STS to less than or equal to 20 seconds to demonstrate improved functional strength and transfer efficiency.  Baseline: 24.53 sec (11/10) Goal status: INITIAL  5.  Pt will improve FGA to 27/30 for decreased fall risk  Baseline: 22/30 Goal status: INITIAL   ASSESSMENT:  CLINICAL IMPRESSION: Emphasis of skilled PT session on initiating goal assessment in preparation for d/c from PT services next visit, adding to HEP for LE NMR and strengthening, and assessing gait with her new AFO and with lifted L shoe. Pt has met 1/1 STG due to being independent with her initial HEP. She is making progress towards her LTG which will be further assessed next session. Pt exhibits decreased pain during gait with her new AFO, however with use of lifted L shoe she exhibits R  hip drop during gait and uneven gait pattern. Will further assess gait with regular shoe and with lifted shoe next session but likely would not recommend shoe be lifted at this point due to gait deviations it is leading to. Continue POC.   OBJECTIVE IMPAIRMENTS: Abnormal gait, decreased balance, decreased coordination, decreased mobility, difficulty walking, decreased ROM, decreased strength, impaired perceived functional ability, increased muscle spasms, impaired flexibility, impaired sensation, impaired UE functional use, and pain.   ACTIVITY LIMITATIONS: carrying, lifting, bending, and stairs  PARTICIPATION LIMITATIONS: community activity and school  PERSONAL FACTORS: Time since onset of injury/illness/exacerbation and 1-2 comorbidities:    history of AVM causing ICH leading to craniotomy and then secondary infection s/p cranioplasty, with resulting right hemiplegia and asthma are also affecting patient's functional outcome.   REHAB POTENTIAL: Good  CLINICAL DECISION MAKING: Stable/uncomplicated  EVALUATION COMPLEXITY: Low  PLAN:  PT FREQUENCY: 1x/week  PT DURATION:  5  sessions  PLANNED INTERVENTIONS: Therapeutic exercises, Therapeutic activity, Neuromuscular re-education, Balance training, Gait training, Patient/Family education, Self Care, Joint mobilization, Stair training, Orthotic/Fit training, DME instructions, Dry Needling, Electrical stimulation, Cryotherapy, Moist heat, Taping, Manual therapy, and Re-evaluation  PLAN FOR NEXT SESSION: assess gait with lift and unlifted shoe, assess LTG d/c from PT  Check all possible CPT codes: 97164 - PT Re-evaluation, 97110- Therapeutic Exercise, [phone]- Neuro Re-education, [phone] - Gait Training, [phone] - Manual Therapy, [phone] - Therapeutic Activities, [phone] - Electrical stimulation (Manual), [phone] - Orthotic Fit, and [id number] - Physical performance training    Check all conditions that are expected to impact treatment: Neurological  condition   If treatment provided at initial evaluation, no treatment charged due to lack of authorization.     Excell Seltzer, PT, DPT, CSRS 02/20/2022, 12:30 PM

## 2022-03-01 ENCOUNTER — Ambulatory Visit: Payer: Medicaid Other | Admitting: Physical Therapy

## 2022-03-01 DIAGNOSIS — I69151 Hemiplegia and hemiparesis following nontraumatic intracerebral hemorrhage affecting right dominant side: Secondary | ICD-10-CM

## 2022-03-01 DIAGNOSIS — R2681 Unsteadiness on feet: Secondary | ICD-10-CM

## 2022-03-01 DIAGNOSIS — Z7409 Other reduced mobility: Secondary | ICD-10-CM

## 2022-03-01 DIAGNOSIS — M6281 Muscle weakness (generalized): Secondary | ICD-10-CM | POA: Diagnosis not present

## 2022-03-01 NOTE — Therapy (Signed)
OUTPATIENT PHYSICAL THERAPY NEURO TREATMENT-DISCHARGE NOTE   Patient Name: Savannah [name] MRN: [id number] DOB:Aug 08, 2000, 21 y.o., female Today's Date: 03/01/2022   PCP: Bernerd Limbo, MD REFERRING PROVIDER: Rocky Link, MD  PHYSICAL THERAPY DISCHARGE SUMMARY  Visits from Start of Care: 5  Current functional level related to goals / functional outcomes: Mod I   Remaining deficits: Decreased RLE strength, decreased balance   Education / Equipment: Handout for HEP   Patient agrees to discharge. Patient goals were partially met. Patient is being discharged due to maximized rehab potential.     PT End of Session - 03/01/22 1021     Visit Number 5    Number of Visits 5   with eval   Date for PT Re-Evaluation 03/08/22   to allow for scheduling conflicts and time to get new AFO   Authorization Type Medicaid    PT Start Time 1020   pt late   PT Stop Time 1047   d/c   PT Time Calculation (min) 27 min    Equipment Utilized During Treatment Gait belt    Activity Tolerance Patient tolerated treatment well    Behavior During Therapy WFL for tasks assessed/performed                 Past Medical History:  Diagnosis Date   Asthma    Stroke [facility])    Past Surgical History:  Procedure Laterality Date   BRAIN SURGERY  02/20/2016   Patient Active Problem List   Diagnosis Date Noted   MDD (major depressive disorder), recurrent severe, without psychosis ([place]) 08/04/2019   Suicide ideation 08/04/2019   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 ([place]) 02/26/2018   Adjustment disorder 02/26/2018   Spasticity 04/15/2017   Impaired functional mobility, balance, gait, and endurance 07/08/2016   AVM (arteriovenous malformation) brain 05/27/2016   Expressive aphasia 03/26/2016   Hydrocephalus, acquired ([place]) 03/07/2016   Neurocognitive deficits 03/07/2016   Intraparenchymal hematoma of brain ([place]) 02/20/2016    Hypertension 02/20/2016   Bradycardia 02/20/2016   Midline shift of brain due to hematoma ([place]) 02/20/2016    ONSET DATE: 12/07/2021  REFERRING DIAG: V76.160 (ICD-10-CM) - 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 (HCC)  THERAPY DIAG:  Muscle weakness (generalized)  Unsteadiness on feet  Hemiplegia and hemiparesis following nontraumatic intracerebral hemorrhage affecting right dominant side (HCC)  Impaired functional mobility, balance, gait, and endurance  Rationale for Evaluation and Treatment: Rehabilitation  SUBJECTIVE:  SUBJECTIVE STATEMENT: "[name]"  Pt reports she has had decreased pain in her R foot when ambulating longer distances with her new shoes with the lift on L side but does have pain when she walks with shoes without the lift. Pt also reports she feels like she is walking "weird" because of the height difference in her shoes.  Pt accompanied by: self and family member grandma Savannah [name]  PERTINENT HISTORY:  history of AVM causing ICH leading to craniotomy and then secondary infection s/p cranioplasty, with resulting right hemiplegia; asthma  PAIN:  Are you having pain? No  PRECAUTIONS: Fall  WEIGHT BEARING RESTRICTIONS: No  FALLS: Has patient fallen in last 6 months? Yes. Number of falls 2; no injuries  LIVING ENVIRONMENT: Lives with:  lives on college campus Lives in: Other dormitory Stairs: No; can access what she needs to on campus via ramps, doesn't have to use stairs Has following equipment at home: None  PLOF: Independent with gait and Independent with transfers  PATIENT GOALS: "find out what is going on with my leg, fix it, and not be in pain constantly"  OBJECTIVE:   DIAGNOSTIC FINDINGS:  Brain MRI  11/15/21 IMPRESSION: No evidence of recent infarction, hemorrhage, mass, or abnormal enhancement.   Sequelae of prior cerebral hemorrhage.   TODAY'S TREATMENT:                                                                                                                              THER ACT: Reassessed LTG:  [facility] PT Assessment - 03/01/22 1035       Standardized Balance Assessment   Standardized Balance Assessment Five Times Sit to Stand;Timed Up and Go Test    Five times sit to stand comments  21.9 sec   pushing with LUE from mat table     Functional Gait  Assessment   Gait assessed  Yes    Gait Level Surface Walks 20 ft in less than 7 sec but greater than 5.5 sec, uses assistive device, slower speed, mild gait deviations, or deviates 6-10 in outside of the 12 in walkway width.    Change in Gait Speed Able to smoothly change walking speed without loss of balance or gait deviation. Deviate no more than 6 in outside of the 12 in walkway width.    Gait with Horizontal Head Turns Performs head turns smoothly with no change in gait. Deviates no more than 6 in outside 12 in walkway width    Gait with Vertical Head Turns Performs head turns with no change in gait. Deviates no more than 6 in outside 12 in walkway width.    Gait and Pivot Turn Pivot turns safely within 3 sec and stops quickly with no loss of balance.    Step Over Obstacle Is able to step over one shoe box (4.5 in total height) but must slow down and adjust steps to clear box safely. May require verbal cueing.    Gait  with Narrow Base of Support Ambulates 4-7 steps.    Gait with Eyes Closed Walks 20 ft, no assistive devices, good speed, no evidence of imbalance, normal gait pattern, deviates no more than 6 in outside 12 in walkway width. Ambulates 20 ft in less than 7 sec.    Ambulating Backwards Walks 20 ft, no assistive devices, good speed, no evidence for imbalance, normal gait    Steps Alternating feet, must use rail.     Total Score 24    FGA comment: 24/30 medium fall risk             GAIT:  Gait pattern: R hip drop noted with stance on LLE, decreased stance time- Right, and decreased hip/knee flexion- Right Distance walked: 115 ft Assistive device utilized: None Level of assistance: Modified independence Comments: with lifted L shoe, slightly decreased R knee hyperextension noted but also R hip drop with stance on LLE  Gait pattern: decreased stance time- Right and decreased hip/knee flexion- Right Distance walked: 115 ft Assistive device utilized: None Level of assistance: Modified independence Comments: with unlifted shoe, decreased deviations noted as compared to with L lifted shoe  Recommending that pt reach out to [employer] to have lift adjusted on her L shoe. Pt has some relief of pain in her R foot with use of lift, however she has significant gait deviations with the lift which may lead to muscle imbalances and low back or hip pain in the future, recommending that pt have lift lowered.   PATIENT EDUCATION: Education details: continue HEP, reach out to [employer] about L shoe lift modifications Person educated: Patient Education method: Customer service manager Education comprehension: verbalized understanding and returned demonstration  HOME EXERCISE PROGRAM: Access Code: [email or web address] URL: [email or web address] Date: 01/11/2022 Prepared by: Excell Seltzer  Exercises - Seated Plantar Fascia Stretch  - 1 x daily - 7 x weekly - 1 sets - 10 reps - 30 sec hold - Seated Ankle Inversion Eversion PROM  - 1 x daily - 7 x weekly - 1 sets - 10 reps - 30 sec hold - Standing Terminal Knee Extension with Resistance  - 1 x daily - 7 x weekly - 3 sets - 15 reps - Side Stepping with Resistance at Ankles  - 1 x daily - 7 x weekly - 1 sets - 5 reps - Forward Monster Walk with Resistance at Ankles and Counter Support  - 1 x daily - 7 x weekly - 1 sets - 5 reps - Long Sitting Calf Stretch  with Strap  - 1 x daily - 7 x weekly - 2 sets - 5 reps - 30-60 sec hold - Staggered Sit-to-Stand  - 1 x daily - 7 x weekly - 3 sets - 10 reps   GOALS: Goals reviewed with patient? Yes  SHORT TERM GOALS: Target date: 02/08/2022  Pt will be independent with initial HEP for improved strength, balance, transfers and gait. Baseline: initiated at eval Goal status: MET  LONG TERM GOALS: Target date: 03/08/2022  Pt will be independent with final HEP for improved strength, balance, transfers and gait. Baseline:  Goal status: MET  2.  Pt will improve gait velocity to at least 3.0 ft/sec for improved gait efficiency and performance at mod I level  Baseline: 2.79 ft/sec (11/10), 2.73 ft/sec (12/20) Goal status: NOT MET  3.  Pt will improve normal TUG to less than or equal to 13 seconds for improved functional mobility and decreased fall risk. Baseline: 17.09 sec (11/10), 13.66 sec (  12/20) Goal status: MET  4.  Pt will improve 5 x STS to less than or equal to 20 seconds to demonstrate improved functional strength and transfer efficiency.  Baseline: 24.53 sec (11/10), 21.9 sec (12/29) Goal status: NOT MET  5.  Pt will improve FGA to 27/30 for decreased fall risk  Baseline: 22/30, 24/30 (12/29) Goal status: NOT MET   ASSESSMENT:  CLINICAL IMPRESSION: Emphasis of skilled PT session on reassessing LTG in preparation for d/c from PT services this date. Pt has met 2/5 LTG due to being independent with her HEP and improving her TUG score from 17.09 sec initially to 13.66 sec this date. Pt did improve her 5xSTS score from 24.53 sec to 21.9 sec this date, her FGA score from 22/30 initially to 24/30 this date but did not quite meet her goal level of these outcome measures. Pt has exhibited decreased gait speed from 2.79 ft/sec to 2.73 ft/sec. Overall, pt has shown improvement in her functional mobility with decreased fall risk. Pt would also benefit from further assessment by [facility] to adjust her left shoe  lift due to it causing significant gait deviations including R hip drop during gait. Pt to d/c from OPPT services this date and continue with her HEP.   OBJECTIVE IMPAIRMENTS: Abnormal gait, decreased balance, decreased coordination, decreased mobility, difficulty walking, decreased ROM, decreased strength, impaired perceived functional ability, increased muscle spasms, impaired flexibility, impaired sensation, impaired UE functional use, and pain.   ACTIVITY LIMITATIONS: carrying, lifting, bending, and stairs  PARTICIPATION LIMITATIONS: community activity and school  PERSONAL FACTORS: Time since onset of injury/illness/exacerbation and 1-2 comorbidities:    history of AVM causing ICH leading to craniotomy and then secondary infection s/p cranioplasty, with resulting right hemiplegia and asthma are also affecting patient's functional outcome.   REHAB POTENTIAL: Good  CLINICAL DECISION MAKING: Stable/uncomplicated  EVALUATION COMPLEXITY: Low   Check all possible CPT codes: 40981 - PT Re-evaluation, 97110- Therapeutic Exercise, [phone]- Neuro Re-education, [phone] - Gait Training, [phone] - Manual Therapy, [phone] - Therapeutic Activities, [phone] - Electrical stimulation (Manual), [phone] - Orthotic Fit, and [phone] - Physical performance training    Check all conditions that are expected to impact treatment: Neurological condition   If treatment provided at initial evaluation, no treatment charged due to lack of authorization.     Excell Seltzer, PT, DPT, CSRS 03/01/2022, 10:47 AM

## 2022-03-20 ENCOUNTER — Encounter: Payer: Medicaid Other | Admitting: Physical Medicine and Rehabilitation

## 2022-04-09 NOTE — Progress Notes (Signed)
Subjective:    Patient ID: Savannah [name], female    DOB: 2001/02/14, 22 y.o.   MRN: [id number]  HPI  Savannah [name] is a 22 y.o. 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 Referral from PCP Dr. Carylon Perches.  They are presenting to PM&R clinic as a new patient  for botox and neuropsych evaluation related to college accommodations.   Chief complaint: R hemiparesis w/ Spasticity Medications tried: On tylenol, baclofen 10 mg TID, Gabapentin 300 mg once daily.  - Has been doing with Dr. Pricilla Holm for 3 years every 3 months, wears off toward the end especially in her foot. Gets injections in RUE and RLE. Has been stable on the same spots for awhile, does not want any changes. States his office is too far and they want to get them done closer. Per his last note 03/05/22:   Right soleus 2 cc to 100 units 75  Right gastrocnemius 2 cc to 100 units 125  Right triceps 2 cc to 100 units 75  Right FDS 2 cc to 100 units 75  Right FPL 2 cc to 100 units 25  Right PT 2 cc to 100 units 25   - Recently got new R AFO with a heel lift on the left shoe, which has not been helping. Orthotist shaved off some of the lift on the left on Monday after PT noted it was hindering her gait. Her Armed forces training and education officer is Gerald Stabs.   - She has a WHO for use at nighttime. She also has a stretch brace AFO, but does not wear it because it is uncomfortable. She got this in the 8th grade and is unsure if it still fits. She has never needed a tendon release.    - She is going to school at [employer], [place] in [employer], site. She states school has been doing well accommodating her disability. She is in the [place] program, they sent IUPs, unsure if and when these need updated. They are uncertain why request for neuropsychology evaluation was made for school. They satte her functional ability, cognition and mood have been stable for some time.   Pain Inventory Average Pain 6 Pain Right Now  0 My pain is intermittent and stabbing  LOCATION OF PAIN  right foot, right leg  BOWEL Number of stools per week: 7   BLADDER Normal    Mobility walk without assistance use a cane how many minutes can you walk? Few hours ability to climb steps?  yes do you drive?  no Do you have any goals in this area?  yes  Function disabled: date disabled 2017 Do you have any goals in this area?  yes  Neuro/Psych trouble walking depression anxiety  Prior Studies Any changes since last visit?  no  Physicians involved in your care Any changes since last visit?  no   Family History  Problem Relation Age of Onset   Depression Mother    Bipolar disorder Mother    Depression Father    Bipolar disorder Father    ADD / ADHD Brother    Migraines Neg Hx    Seizures Neg Hx    Anxiety disorder Neg Hx    Schizophrenia Neg Hx    Autism Neg Hx    Social History   Socioeconomic History   Marital status: Single    Spouse name: Not on file   Number of children: Not on file   Years  of education: Not on file   Highest education level: Not on file  Occupational History   Not on file  Tobacco Use   Smoking status: Never   Smokeless tobacco: Never  Substance and Sexual Activity   Alcohol use: Never   Drug use: Never   Sexual activity: Never  Other Topics Concern   Not on file  Social History Narrative   Unnamed is a freshmen at [employer]   She lives in a dormitory.      IEP in place.     Social Determinants of Health   Financial Resource Strain: Not on file  Food Insecurity: Not on file  Transportation Needs: Not on file  Physical Activity: Not on file  Stress: Not on file  Social Connections: Not on file   Past Surgical History:  Procedure Laterality Date   BRAIN SURGERY  02/20/2016   Past Medical History:  Diagnosis Date   Asthma    Stroke [facility])    There were no vitals taken for this visit.  Opioid Risk Score:   Fall Risk Score:  `1  Depression screen PHQ  2/9      No data to display          Review of Systems  Musculoskeletal:  Positive for gait problem.  Psychiatric/Behavioral:         Depression, anxiety  All other systems reviewed and are negative.      Objective:   Physical Exam  Constitutional: No apparent distress. Appropriate appearance for age.  HENT: No JVD. Neck Supple. Trachea midline. Atraumatic, normocephalic. Eyes: PERRLA. EOMI. Visual fields grossly intact.  Cardiovascular: RRR, no murmurs/rub/gallops. No Edema. Peripheral pulses 2+  Respiratory: CTAB. No rales, rhonchi, or wheezing. On RA.  Abdomen: + bowel sounds, normoactive. No distention or tenderness.  Skin: C/D/I. No apparent lesions. MSK:      PROM R wrist to <-5, R ankle -5--10; otherwise full PROM in all extremities      Strength:                RUE: 2/5 SA, 0/5 EF, 1/5 EE, 0/5 WE, 0/5 FF, 0/5 FA                 LUE: 5/5 SA, 5/5 EF, 5/5 EE, 5/5 WE, 5/5 FF, 5/5 FA                 RLE: 4/5 HF, 4/5 KE, 0/5 DF, 0/5 EHL, 0/5 PF                 LLE:  5/5 HF, 5/5 KE, 5/5 DF, 5/5 EHL, 5/5 PF   Neurologic exam:  Cognition: AAO to person, place, time and event.  Insight: Good  insight into current condition.  Mood: Pleasant affect, appropriate mood.  Sensation: To light touch intact in BL UEs and LEs  Reflexes: Hyperreflexia in RUE and RLE. Negative Hoffman's and babinski signs bilaterally.   CN: + R facial droop, +R ptosis, mild Coordination: No apparent tremors. No ataxia on FTN, HTS on L.  Spasticity: MAS 2 triceps, MAS 3 wrist flexors, MAS 1 supinators, MAS 1+ finger flexors, thumb adductors, MAS 1+ shoulder internal rotators, MAS 1 knee flexors, MAS 3 gastrocs, MAS 3 soleus, Mas 2 foot adductors Gait: +L heel dragging. R foot outturned and wide based for support. Mild trendelenberg gait. +R AFO allowing adequate R heel clearance on swing.         Assessment & Plan:   Savannah  [name] is a 22 y.o. year old female  who has PMHx  L hemorraghic  stroke 2/2 AVM, now with R hemiparesis. Referral from PCP Dr. Carylon Perches.  They are presenting to PM&R clinic as a new patient  for botox and neuropsych evaluation related to college accommodations. .    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 (HCC) Assessment & Plan: I have written a script for a nighttime stretch splint. Please get this from [facility]. Wear for 1-2 hours initially as tolerated, and increase to 6 hours overnight gradually. Watch for any wounds, pain, or non-blancheable skin after 15 minutes of taking off the brace.    Spasticity Assessment & Plan: Will plan for 400U botox at same sites and amounts as Dr. Pricilla Holm to start; may adjust in the future. Current plan: Right soleus  75  Right gastrocnemius 125  *may redistribute 25U to tibialis posterior for ankle inversion  Right triceps  75  Right FDS  75  Right FPL  25  Right PT  25   Follow up with me every 3 months for botox   Expressive aphasia Assessment & Plan: Let me know if you need accomodations for school. I'll talk to Dr. Rogers Blocker about reasoning behind neuropsych referral.    Impaired functional mobility, balance, gait, and endurance Assessment & Plan: Has new AFO RLE, tolerating well  Heel lift on LLE still impeding ambulation, causing heel dragging with trendelenberg gait. Family plans to return to orthotist for re-evaluation.  Continue PT, OT, HEP    Gertie Gowda, DO 04/12/2022

## 2022-04-10 ENCOUNTER — Encounter
Payer: Medicaid Other | Attending: Physical Medicine and Rehabilitation | Admitting: Physical Medicine and Rehabilitation

## 2022-04-10 ENCOUNTER — Encounter: Payer: Self-pay | Admitting: Physical Medicine and Rehabilitation

## 2022-04-10 VITALS — BP 109/71 | HR 71 | Ht 63.2 in | Wt 149.0 lb

## 2022-04-10 DIAGNOSIS — I69151 Hemiplegia and hemiparesis following nontraumatic intracerebral hemorrhage affecting right dominant side: Secondary | ICD-10-CM | POA: Diagnosis present

## 2022-04-10 DIAGNOSIS — Z7409 Other reduced mobility: Secondary | ICD-10-CM | POA: Insufficient documentation

## 2022-04-10 DIAGNOSIS — R252 Cramp and spasm: Secondary | ICD-10-CM | POA: Insufficient documentation

## 2022-04-10 DIAGNOSIS — R4701 Aphasia: Secondary | ICD-10-CM | POA: Diagnosis present

## 2022-04-10 NOTE — Patient Instructions (Signed)
Please request records from the last round of botox fm Dr. Pricilla Holm  I have written a script for a nighttime stretch splint. Please get this from [place]. Wear for 1-2 hours initially as tolerated, and increase to 6 hours overnight gradually. Watch for any wounds, pain, or non-blancheable skin after 15 minutes of taking off the brace.  Let me know if you need accomodations for school. I'll talk to Dr. Rogers Blocker about reasoning behind neuropsych referral.    Follow up with me every 3 months for botox

## 2022-04-12 NOTE — Assessment & Plan Note (Signed)
I have written a script for a nighttime stretch splint. Please get this from [facility]. Wear for 1-2 hours initially as tolerated, and increase to 6 hours overnight gradually. Watch for any wounds, pain, or non-blancheable skin after 15 minutes of taking off the brace.

## 2022-04-12 NOTE — Assessment & Plan Note (Signed)
Has new AFO RLE, tolerating well  Heel lift on LLE still impeding ambulation, causing heel dragging with trendelenberg gait. Family plans to return to orthotist for re-evaluation.  Continue PT, OT, HEP

## 2022-04-12 NOTE — Assessment & Plan Note (Addendum)
Will plan for 400U botox at same sites and amounts as Dr. Pricilla Holm to start; may adjust in the future. Current plan: Right soleus  75  Right gastrocnemius 125  *may redistribute 25U to tibialis posterior for ankle inversion  Right triceps  75  Right FDS  75  Right FPL  25  Right PT  25   Follow up with me every 3 months for botox

## 2022-04-12 NOTE — Assessment & Plan Note (Signed)
Let me know if you need accomodations for school. I'll talk to Dr. Rogers Blocker about reasoning behind neuropsych referral.

## 2022-05-29 ENCOUNTER — Encounter: Payer: Self-pay | Admitting: Physical Medicine and Rehabilitation

## 2022-05-29 ENCOUNTER — Other Ambulatory Visit: Payer: Self-pay

## 2022-05-29 ENCOUNTER — Encounter
Payer: Medicaid Other | Attending: Physical Medicine and Rehabilitation | Admitting: Physical Medicine and Rehabilitation

## 2022-05-29 VITALS — BP 110/72 | HR 68 | Temp 98.1°F | Ht 63.2 in | Wt 154.6 lb

## 2022-05-29 DIAGNOSIS — R252 Cramp and spasm: Secondary | ICD-10-CM | POA: Insufficient documentation

## 2022-05-29 MED ORDER — BACLOFEN 10 MG PO TABS: 5.0000 mg | ORAL_TABLET | Freq: Three times a day (TID) | ORAL | 11 refills | Status: DC

## 2022-05-29 MED ORDER — ONABOTULINUMTOXINA 100 UNITS IJ SOLR
400.0000 [IU] | Freq: Once | INTRAMUSCULAR | Status: AC
  Administered 2022-05-29: 400 [IU] via INTRAMUSCULAR

## 2022-05-29 NOTE — Patient Instructions (Signed)
Use tylenol and ice as needed for pain Follow up in 6-8 weeks

## 2022-05-29 NOTE — Progress Notes (Signed)
Amybeth Meghana Kriley is a 22 y.o. 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 Referral from PCP Dr. Carylon Perches.  They are presenting to PM&R clinic  for botox injection.  No changes since last clinic evaluation.   ROS: Denies fevers, chills, N/V, abdominal pain, constipation, diarrhea, SOB, cough, chest pain, new weakness or paraesthesias.     PE: Constitution: Appropriate appearance for age. No apparent distress  Resp: No respiratory distress. No accessory muscle usage. on RA Cardio: Well perfused appearance. No peripheral edema. Abdomen: Nondistended. Nontender.   Psych: Appropriate mood and affect. Neuro: AAOx4. No apparent cognitive deficits. R hemiparesis.    Botolulinum Toxin Injection: [x]  BOTOX (onabotulinumtoxinA) [_] DYSPORT (abobotulinumtoxinA)  Goals with treatment: [ x] Decrease spasms/ abnormal movements [x ] Improve Active / Passive ROM [ x] Improve ADLs [ x] Improve functional mobility [x ] Improve gait mechanics [ x] Improve positioning/posture [ ]  Prevent contracture  [ ]  Prevent joint destruction [ ]  Prevent skin breakdown [ ]  Decrease caregiver burden [ ]  Improve hygiene [x ] Improve Pain  MEDICATION:  ONAbotulinum toxin. 400u Units    NaCl 8 mL   CONSENT: Obtained in writing followed by time-out per [facility] policy. Consent uploaded to chart.  Benefits discussed included, but were not limited to, decreased muscle tightness and spasticity, increased joint range of motion, improved limb positioning and facilitation of hygiene and nursing care.   Risks discussed included, but were not limited to, pain and discomfort, bleeding, bruising, excessive weakness, venous thrombosis, muscle atrophy, and distant spread of toxin which could include generalized muscle weakness, diplopia, blurred vision, ptosis, dysphagia, dysphonia, dysarthria, urinary incontinence and breathing difficulties. These symptoms have been  reported hours to weeks after injection. Swallowing and breathing difficulties may be life threatening, and there have been reports of death. Patient/Family member/Guardian/Caregiver have been offered botulinum toxin informational material upon initial consultation and this information has been continually available. All questions answered to patient/family member/guardian/ caregiver satisfaction. They would like to proceed with procedure. There are no noted contraindications to procedure.  PROCEDURE [x ] Without Ultrasound: Patient was placed in a position with the appropriate muscles exposed, located and identified. The skin was cleaned with ChloraPrep and ethyl chloride was sprayed for topical anesthetic. Using combination EMG amplification and electrical stimulation, the following muscles were identified using anatomical landmarks described by Perotto, et al (1994) and injected following aspiration to ensure blood vessels were avoided.  [_ ] With Ultrasound: Patient was placed in a position with the appropriate muscles exposed, located and identified. The skin was cleaned with ChloraPrep and ethyl chloride was sprayed for topical anesthetic. Using combination Ultrasound guidance for anatomical guidance, avoidance of significant vasculature, to decrease the risk of hematoma formation and ensure botox was placed in the correct location; EMG amplification and electrical stimulation, the following muscles were identified and injected following aspiration to ensure blood vessels were avoided. A _linear transducer was used during the procedure. The botulinum toxin was visualized entering the appropriate musculature.   MUSCLE: Units /Sites Right soleus  75 U Right gastrocnemius 125 U - 2 sites (medial, lateral)  Right triceps  75 U - 2 sites Right FDS  75 U Right FPL  25 U Right PT  25 U   400 units were injected without difficulty. No complications were encountered. The patient tolerated the procedure  well. Wasted 0  Plan:  Use tylenol and ice as needed for pain Follow up in  6-8 weeks too assess effectiveness and plan next set of injections.

## 2022-05-29 NOTE — Telephone Encounter (Signed)
Please send Baclofen refill to [facility] on [address]. Thank you

## 2022-07-10 ENCOUNTER — Ambulatory Visit: Payer: Medicaid Other | Admitting: Physical Medicine and Rehabilitation

## 2022-07-17 ENCOUNTER — Encounter: Payer: Self-pay | Admitting: Physical Medicine and Rehabilitation

## 2022-07-17 ENCOUNTER — Encounter
Payer: Medicaid Other | Attending: Physical Medicine and Rehabilitation | Admitting: Physical Medicine and Rehabilitation

## 2022-07-17 VITALS — BP 98/65 | HR 67 | Ht 63.0 in | Wt 155.8 lb

## 2022-07-17 DIAGNOSIS — M549 Dorsalgia, unspecified: Secondary | ICD-10-CM | POA: Insufficient documentation

## 2022-07-17 DIAGNOSIS — R252 Cramp and spasm: Secondary | ICD-10-CM | POA: Insufficient documentation

## 2022-07-17 DIAGNOSIS — G479 Sleep disorder, unspecified: Secondary | ICD-10-CM | POA: Diagnosis present

## 2022-07-17 DIAGNOSIS — Z7409 Other reduced mobility: Secondary | ICD-10-CM | POA: Insufficient documentation

## 2022-07-17 DIAGNOSIS — I69151 Hemiplegia and hemiparesis following nontraumatic intracerebral hemorrhage affecting right dominant side: Secondary | ICD-10-CM | POA: Insufficient documentation

## 2022-07-17 NOTE — Progress Notes (Signed)
Subjective:    Patient ID: Savannah [name], female    DOB: 2000/09/11, 22 y.o.   MRN: [id number]  HPI   Ibtihaj Gasco is a 22 y.o. year old female  who  has a past medical history of Asthma and Stroke (HCC).   They are presenting to PM&R clinic for follow up related to R spastic hemiparesis s/p stroke .  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 Please get this from [facility]. Wear for 1-2 hours initially as tolerated, and increase to 6 hours overnight gradually. Watch for any wounds, pain, or non-blancheable skin after 15 minutes of taking off the brace.       Spasticity Assessment & Plan: Will plan for 400U botox at same sites and amounts as Dr. Cephus Richer to start; may adjust in the future. Current plan:   Right soleus  75  Right gastrocnemius 125  *may redistribute 25U to tibialis posterior for ankle inversion  Right triceps  75  Right FDS  75  Right FPL  25  Right PT  25    Follow up with me every 3 months for botox     Expressive aphasia Assessment & Plan: Let me know if you need accomodations for school. I'll talk to Dr. Artis Flock about reasoning behind neuropsych referral.      Impaired functional mobility, balance, gait, and endurance Assessment & Plan: Has new AFO RLE, tolerating well   Heel lift on LLE still impeding ambulation, causing heel dragging with trendelenberg gait. Family plans to return to orthotist for re-evaluation.   Continue PT, OT, HEP   Interval Hx:  - Therapies: None currently. Not doing HEP. Now that it's summer, she is hopeing to be more active but isnt sure what activities she is going to do; might swim.    - Follow ups: none   - Falls:none   - DME: Might switch over to a C-brace once her insurance qualifies for it in a few months. She is talking to her orthotist about a  trial of this. She has seen videos of people running and mobility improvements with it.    - Medications: Feels botox went well; everything's good   - Other concerns: Endorsing back pain in mid-low back, all the way across her back. Radiates up to her neck. No HAs. She states the pain has been there for a month or two, intermittent but increasing in frequency. No exacerbating factors, worse in the AM when she wakes. Remitted by heat pads. Has tried tylenol with mild benefit. Grandparents are worried the pain in her back is form her shoe lift.   She usually falls asleep on her Ipad or reading; inconsistent times between 11-3 am. Does not take any sleep aides. She feels her sleep quality is usually not good, she does not usually take naps but will sleep into the afternoon sometimes. No alcohol or recreational drug use.    Pain Inventory Average Pain 5 Pain Right Now 4 My pain is aching  In the last 24 hours, has pain interfered with the following? General activity 7 Relation with others 3 Enjoyment of life 2 What TIME of day is your pain at its worst? morning  Sleep (in general) Poor  Pain is worse with: walking and standing Pain improves with: rest, heat/ice, and medication Relief from Meds:  na  Family History  Problem Relation Age of Onset   Depression Mother    Bipolar disorder Mother    Depression Father    Bipolar disorder Father    ADD / ADHD Brother    Migraines Neg Hx    Seizures Neg Hx    Anxiety disorder Neg Hx    Schizophrenia Neg Hx    Autism Neg Hx    Social History   Socioeconomic History   Marital status: Single    Spouse name: Not on file   Number of children: Not on file   Years of education: Not on file   Highest education level: Not on file  Occupational History   Not on file  Tobacco Use   Smoking status: Never   Smokeless tobacco: Never  Vaping Use   Vaping Use: Some days   Substances: Nicotine  Substance and Sexual Activity   Alcohol use:  Never   Drug use: Yes    Types: Marijuana    Comment: sometimes   Sexual activity: Never  Other Topics Concern   Not on file  Social History Narrative   Deztini is a freshmen at [employer]   She lives in a dormitory.      IEP in place.     Social Determinants of Health   Financial Resource Strain: Not on file  Food Insecurity: Not on file  Transportation Needs: Not on file  Physical Activity: Not on file  Stress: Not on file  Social Connections: Not on file   Past Surgical History:  Procedure Laterality Date   BRAIN SURGERY  02/20/2016   Past Surgical History:  Procedure Laterality Date   BRAIN SURGERY  02/20/2016   Past Medical History:  Diagnosis Date   Asthma    Stroke (HCC)    BP 98/65   Pulse 67   Ht 5\' 3"  (1.6 m)   Wt 155 lb 12.8 oz (70.7 kg)   SpO2 97%   BMI 27.60 kg/m   Opioid Risk Score:   Fall Risk Score:  `1  Depression screen [facility] 2/9     07/17/2022    9:09 AM 05/29/2022    9:08 AM 04/10/2022    9:27 AM  Depression screen PHQ 2/9  Decreased Interest 1 1 2   Down, Depressed, Hopeless 1 1 3   PHQ - 2 Score 2 2 5   Altered sleeping   2  Tired, decreased energy   3  Change in appetite   2  Feeling bad or failure about yourself    1  Trouble concentrating   2  Moving slowly or fidgety/restless   1  Suicidal thoughts   1  PHQ-9 Score   17     Review of Systems  Constitutional: Negative.   HENT: Negative.    Eyes: Negative.   Respiratory: Negative.    Cardiovascular: Negative.   Gastrointestinal: Negative.   Endocrine: Negative.   Genitourinary: Negative.   Musculoskeletal:  Positive for back pain and gait problem.       Right leg  Skin: Negative.   Allergic/Immunologic: Negative.   Hematological: Negative.   Psychiatric/Behavioral:  Positive for dysphoric mood.   All other systems reviewed and are negative.      Objective:   Physical Exam Constitutional: No apparent distress. Appropriate appearance for age.  HENT: No JVD. Neck Supple.  Trachea midline. Atraumatic, normocephalic. Eyes: PERRLA. EOMI. Visual fields grossly intact.  Cardiovascular: RRR, no murmurs/rub/gallops. No Edema. Peripheral pulses 2+  Respiratory: CTAB. No rales, rhonchi, or wheezing. On  RA.  Abdomen: + bowel sounds, normoactive. No distention or tenderness.  Skin: C/D/I. No apparent lesions. MSK:      PROM R wrist to <-5, R ankle 0 with significant effort; otherwise full PROM in all extremities   + TTP and tightness L>R thoracic paraspinals Exacerated thoracolumbar lordosis  Strength: RUE: 1/5 finger flexior, wrist extension, elbow extension; 2/5 shoulder abduction RLE 1/5 EHL, DF; 0/5 PF; 3/5 KE, KF, HF  Tone:  MAS 3 plantarflexors, MAS 2 knee flexors MAS 2 finger flexors, thumb adductor; MAS 3 wrist flexors; MAS 2 elbow flexors  Gait: + trendelenberg gait with R hip weakness; compensates by leaning over left leg in stance       Assessment & Plan:   Mahum Graben is a 22 y.o. year old female  who  has a past medical history of Asthma and Stroke (HCC).    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 Approximate distribution:   Right soleus  100 U - 2 sites Right gastrocnemius 150 U - 4 sites (2 medial, 2 lateral)  Right triceps  75 U - 2 sites Right FDS  75 U Right FCU 25 U Right FCR 25 U Right FPL  25 U Right PT  25 U   Follow up in next 2 weeks for botox  Impaired functional mobility, balance, gait, and endurance Look into getting C-brace Afo for RLE to allow improved gait pattern and assist in going up/down steps   Wear your nighttime wrist and ankle splints to prevent further contractures; wear 6 hours nightly unless causing areas of skin redness that stay >5 minutes after  taking off braces. Bring braces into your orthotist for adjustment if these areas occur.   Mid back pain on left side I think this is likely due to you overcompensating with your left leg/back for right sided weakness when walking Use over the counter Aspercreme or Voltaren gel twice daily, and heating pad as desired. You can also use Tylenol 650 mg tablets, 2 tabs up to twice daily.  I am giving you stretching exercises for your back to do nightly.  We will follow up on this next visit.   Sleep difficulty  For sleep, I want you to pick a time to lay down every night, ideally between 8 and 10 PM.    Starting 1 hour before you want to go to sleep, turn off all television screens, phone screens, tablets, and computers.    Keep the lights low and perform only low stimulation activities, such as reading.    Only use your bedroom for sleep.  You may also take 3 to 5 mg of over-the-counter melatonin approximately 1 hour before bedtime.

## 2022-07-17 NOTE — Patient Instructions (Signed)
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 Bring braces into your orthotist for adjustment if these areas occur.   Mid back pain on left side I think this is likely due to you overcompensating with your left leg/back for right sided weakness when walking Use over the counter Aspercreme or Voltaren gel twice daily, and heating pad as desired. You can also use Tylenol 650 mg tablets, 2 tabs up to twice daily.  I am giving you stretching exercises for your back to do nightly.  We will follow up on this next visit.   Sleep difficulty  For sleep, I want you to pick a time to lay down every night, ideally between 8 and 10 PM.    Starting 1 hour before you want to go to sleep, turn off all television screens, phone screens, tablets, and computers.    Keep the lights low and perform only low stimulation activities, such as reading.    Only use your bedroom for sleep.  You may also take 3 to 5 mg of over-the-counter melatonin approximately 1 hour before bedtime.

## 2022-08-28 ENCOUNTER — Encounter
Payer: Medicaid Other | Attending: Physical Medicine and Rehabilitation | Admitting: Physical Medicine and Rehabilitation

## 2022-08-28 DIAGNOSIS — M549 Dorsalgia, unspecified: Secondary | ICD-10-CM | POA: Insufficient documentation

## 2022-08-28 DIAGNOSIS — G479 Sleep disorder, unspecified: Secondary | ICD-10-CM | POA: Insufficient documentation

## 2022-08-28 DIAGNOSIS — Z7409 Other reduced mobility: Secondary | ICD-10-CM | POA: Insufficient documentation

## 2022-08-28 DIAGNOSIS — R252 Cramp and spasm: Secondary | ICD-10-CM | POA: Insufficient documentation

## 2022-08-28 DIAGNOSIS — I69151 Hemiplegia and hemiparesis following nontraumatic intracerebral hemorrhage affecting right dominant side: Secondary | ICD-10-CM | POA: Insufficient documentation

## 2022-10-07 ENCOUNTER — Ambulatory Visit (INDEPENDENT_AMBULATORY_CARE_PROVIDER_SITE_OTHER): Payer: Medicaid Other | Admitting: Pediatrics

## 2022-10-09 ENCOUNTER — Encounter: Payer: MEDICAID | Admitting: Physical Medicine and Rehabilitation

## 2022-10-30 ENCOUNTER — Encounter: Payer: MEDICAID | Admitting: Physical Medicine and Rehabilitation

## 2022-10-30 ENCOUNTER — Encounter: Payer: Self-pay | Admitting: Physical Medicine and Rehabilitation

## 2022-10-30 VITALS — BP 114/72 | HR 74 | Ht 63.1 in | Wt 154.0 lb

## 2022-11-01 ENCOUNTER — Encounter: Payer: MEDICAID | Attending: Physical Medicine and Rehabilitation | Admitting: Physical Medicine and Rehabilitation

## 2022-11-01 ENCOUNTER — Encounter: Payer: Self-pay | Admitting: Physical Medicine and Rehabilitation

## 2022-11-01 VITALS — BP 105/71 | HR 75 | Ht 63.1 in | Wt 155.0 lb

## 2022-11-01 DIAGNOSIS — I69151 Hemiplegia and hemiparesis following nontraumatic intracerebral hemorrhage affecting right dominant side: Secondary | ICD-10-CM | POA: Diagnosis present

## 2022-11-01 DIAGNOSIS — R252 Cramp and spasm: Secondary | ICD-10-CM

## 2022-11-01 DIAGNOSIS — Z7409 Other reduced mobility: Secondary | ICD-10-CM | POA: Diagnosis present

## 2022-11-01 MED ORDER — ONABOTULINUMTOXINA 100 UNITS IJ SOLR
500.0000 [IU] | Freq: Once | INTRAMUSCULAR | Status: AC
  Administered 2022-11-01: 500 [IU] via INTRAMUSCULAR

## 2022-11-01 MED ORDER — SODIUM CHLORIDE (PF) 0.9 % IJ SOLN
5.0000 mL | Freq: Once | INTRAMUSCULAR | Status: AC
  Administered 2022-11-01: 5 mL

## 2022-11-01 NOTE — Progress Notes (Signed)
Savannah [name] is a 22 y.o. 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 Referral from PCP Dr. Lorenz Coaster.  They are presenting to PM&R clinic  for botox injection.   No changes since last clinic evaluation. L hip/low back pain has resolved.      ROS: Denies fevers, chills, N/V, abdominal pain, constipation, diarrhea, SOB, cough, chest pain, new weakness or paraesthesias.       PE: Constitution: Appropriate appearance for age. No apparent distress  Resp: No respiratory distress. No accessory muscle usage. on RA Cardio: Well perfused appearance. No peripheral edema. Abdomen: Nondistended. Nontender.   Psych: Appropriate mood and affect. Neuro: AAOx4. No apparent cognitive deficits. R hemiparesis.  MAS 2 R elbow extensors, MAS 3 wrist flexors, MAS 4 finger flexors/thumb flexor at MCP, PIP, and DIP, MAS 2 pronator MAS 3 R plantarflexors  MSK:  Slight flexion contractures of distal finger flexors; PIP subluxations with extension PROM R ankle -5-0 degrees   Botolulinum Toxin Injection: [x]  BOTOX (onabotulinumtoxinA) [_] DYSPORT (abobotulinumtoxinA)  Goals with treatment: [ x] Decrease spasms/ abnormal movements [ x] Improve Active / Passive ROM [x ] Improve ADLs [x ] Improve functional mobility [x ] Improve gait mechanics [x ] Improve positioning/posture [ x] Prevent contracture  [ ]  Prevent joint destruction [ ]  Prevent skin breakdown [ ]  Decrease caregiver burden [ ]  Improve hygiene [x ] Improve Pain  MEDICATION:  ONAbotulinum toxin. 500 Units    NaCl 5 ccs     CONSENT: Obtained in writing followed by time-out per [facility] policy. Consent uploaded to chart.  Benefits discussed included, but were not limited to, decreased muscle tightness and spasticity, increased joint range of motion, improved limb positioning and facilitation of hygiene and nursing care.   Risks discussed included, but were not limited to, pain and  discomfort, bleeding, bruising, excessive weakness, venous thrombosis, muscle atrophy, and distant spread of toxin which could include generalized muscle weakness, diplopia, blurred vision, ptosis, dysphagia, dysphonia, dysarthria, urinary incontinence and breathing difficulties. These symptoms have been reported hours to weeks after injection. Swallowing and breathing difficulties may be life threatening, and there have been reports of death. Patient/Family member/Guardian/Caregiver have been offered botulinum toxin informational material upon initial consultation and this information has been continually available. All questions answered to patient/family member/guardian/ caregiver satisfaction. They would like to proceed with procedure. There are no noted contraindications to procedure.  PROCEDURE Arly.Keller ] Without Ultrasound: Patient was placed in a position with the appropriate muscles exposed, located and identified. The skin was cleaned with ChloraPrep and ethyl chloride was sprayed for topical anesthetic. Using combination EMG amplification and electrical stimulation, the following muscles were identified using anatomical landmarks described by Perotto, et al (1994) and injected following aspiration to ensure blood vessels were avoided.  [_ ] With Ultrasound: Patient was placed in a position with the appropriate muscles exposed, located and identified. The skin was cleaned with ChloraPrep and ethyl chloride was sprayed for topical anesthetic. Using combination Ultrasound guidance for anatomical guidance, avoidance of significant vasculature, to decrease the risk of hematoma formation and ensure botox was placed in the correct location; EMG amplification and electrical stimulation, the following muscles were identified and injected following aspiration to ensure blood vessels were avoided. A _linear transducer was used during the procedure. The botulinum toxin was visualized entering the appropriate  musculature.   MUSCLE: Units /Sites Right soleus  100 U - 2 sites Right gastrocnemius 75 U medial /  75 U lateral    Right triceps  75 U - 2 sites Right FDS  75 U Right FCU 25 U Right FCR 25 U Right FPL  25 U Right PT  25 U   500 units were injected without difficulty. No complications were encountered. The patient tolerated the procedure well. Wasted 0  Plan:   - Repeat injections Q3Months as beneficial; will not increase units but may re-distribute based on patient feedback next appointment

## 2022-12-12 ENCOUNTER — Ambulatory Visit (INDEPENDENT_AMBULATORY_CARE_PROVIDER_SITE_OTHER): Payer: Medicaid Other | Admitting: Pediatrics

## 2022-12-17 NOTE — Progress Notes (Signed)
Patient rescheduled.

## 2022-12-24 NOTE — Progress Notes (Signed)
Patient: Savannah [name] MRN: [id number] Sex: female DOB: September 29, 2000  Provider: Lorenz Coaster, MD Location of Care: [facility]  Note type: Routine follow-up  History of Present Illness:  Savannah [name] is a 22 y.o. 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 Patient was last seen on 12/03/2021 where I recommended improving lifestyle habits to help improve orthostatic hypotension and headaches, referred to Adult PM&R, PT, and OT, and ordered new AFOs.  Since the last appointment, patient has transitioned care from Dr. Cephus Richer to Dr. Shearon Stalls with whom she has continued to follow. She had 6 PT visits in 2023.  Patient presents today with grandmother.  Patient and grandmother both provide history.   SInce last appointment, botox is going well. Back pain and sleep improved.  Haven't had any headache. No dizziness lately.   College at [employer] is going well.  She is becoming more independent.  Lsst year home every weekend.  Dorm is working well.  Her classes are all working well, good accommodations. She is working on Occupational psychologist for a Audiological scientist, working with Technical sales engineer.Marland Kitchen   Has not done PT.   Diagnostics:  MRI of Brain 11/15/21 Impression:  No evidence of recent infarction, hemorrhage, mass, or abnormal enhancement.   Sequelae of prior cerebral hemorrhage.   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   Interval LEFT frontoparietotemporal craniotomy and cranioplasty.   No acute intracranial abnormalities.   Past Medical History Past Medical History:  Diagnosis Date   Asthma    Stroke [facility])     Surgical History Past Surgical History:  Procedure  Laterality Date   BRAIN SURGERY  02/20/2016    Family History family history includes ADD / ADHD in her brother; Bipolar disorder in her father and mother; Depression in her father and mother.   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      IEP in place.      Allergies Allergies  Allergen Reactions   Hydrocodone-Acetaminophen Diarrhea, Nausea And Vomiting and Other (See Comments)    Hallucinations  Other Reaction(s): GI Intolerance, Psychosis   Hydrocodone-Acetaminophen Other (See Comments)    Hallucinations     Medications Current Outpatient Medications on File Prior to Visit  Medication Sig Dispense Refill   baclofen (LIORESAL) 10 MG tablet Take 0.5 tablets (5 mg total) by mouth 3 (three) times daily. Take by mouth. 45 each 11   gabapentin (NEURONTIN) 300 MG capsule SMARTSIG:1 Capsule(s) By Mouth Every Evening     hydrOXYzine (VISTARIL) 25 MG capsule Take by mouth.     lamoTRIgine (LAMICTAL) 150 MG tablet Take by mouth.     montelukast (SINGULAIR) 10 MG tablet Take by mouth.     acetaminophen (TYLENOL) 500 MG tablet Take by mouth. (Patient not taking: Reported on 01/02/2023)     albuterol (VENTOLIN HFA) 108 (90 Base) MCG/ACT inhaler Inhale into the lungs. (Patient not taking: Reported on 01/02/2023)     meclizine (ANTIVERT) 25 MG tablet Take 1 tablet (25 mg total) by mouth 3 (three) times daily as needed for dizziness. (Patient not taking: Reported on 01/02/2023) 15 tablet 0  norethindrone (MICRONOR) 0.35 MG tablet Take 1 tablet by mouth daily. (Patient not taking: Reported on 01/02/2023)     PSYLLIUM PO Take by mouth. (Patient not taking: Reported on 01/02/2023)     No current facility-administered medications on file prior to visit.   The medication list was reviewed and reconciled. All changes or newly prescribed medications were explained.  A complete medication list was provided to the  patient/caregiver.  Physical Exam BP 110/72 (BP Location: Left Arm, Patient Position: Sitting, Cuff Size: Normal)   Pulse 76   Ht 5' 2.99" (1.6 m)   Wt 154 lb 12.8 oz (70.2 kg)   BMI 27.43 kg/m  Facility age limit for growth %iles is 20 years.  No results found. Gen: well appearing teen Skin: No rash, No neurocutaneous stigmata. HEENT: Normocephalic, no dysmorphic features, no conjunctival injection, nares patent, mucous membranes moist, oropharynx clear.  Neck: Supple, no meningismus. No focal tenderness. Resp: Clear to auscultation bilaterally CV: Regular rate, normal S1/S2, no murmurs, no rubs Abd: BS present, abdomen soft, non-tender, non-distended. No hepatosplenomegaly or mass Ext: Warm and well-perfused. No deformities, no muscle wasting, ROM full.   Neurological Examination: MS: Awake, alert, interactive. Normal eye contact, answered the questions appropriately for age, speech was fluent with slight dysarthria.  Normal comprehension.  Attention and concentration were normal. Cranial Nerves: Pupils were equal and reactive to light;  EOM normal, no nystagmus; no ptsosis. Mild assymetry of the face with drooping on the left. but able to activate muscles bilaterally with effort.Hearing intact grossly, palate elevation is symmetric, tongue protrusion is symmetric with full movement to both sides.   Motor-Full strength on left side. Moderate spasticity in right arm and hand.  No increased toned in right leg.  Increased tone but unable to quantify in right ankle due to AFO.  1/4 strength in deltoid. 2/4 stength in bicep. 2/4 strength fingers.    Gait: Stable gait.  Decreased arm swing and foot raise on right.   Diagnosis: 1. 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 Neurocognitive deficits   3. Spasticity      Assessment and Plan Savannah [name] is a 22 y.o. female with history of AVM causing ICH leading to  craniotomy and then secondary infection s/p cranioplasty, with resulting right hemiplegia who I am seeing in follow-up. Patient overall doing well.  Care now being provided by PM&R.  Discussed that she does not need to continue with myself and Dr Higinio Plan.  However did discuss options to assist with spasticity and equipment today.    Recommend contacting [facility] about a PT evaluation for equipment Consider increasing baclofen for spasticity. You can discuss this with Dr. Shearon Stalls. No follow-up needed in neurology clinic.    I spent 25 minutes on day of service on this patient including review of chart, discussion with patient and family, discussion of screening results, coordination with other providers and management of orders and paperwork.     Return if symptoms worsen or fail to improve.  Lorenz Coaster MD MPH Neurology and Neurodevelopment [facility]  [address] [place], [place], [place] [address] Phone: [phone] Fax: [phone]

## 2023-01-02 ENCOUNTER — Encounter (INDEPENDENT_AMBULATORY_CARE_PROVIDER_SITE_OTHER): Payer: Self-pay | Admitting: Pediatrics

## 2023-01-02 ENCOUNTER — Ambulatory Visit (INDEPENDENT_AMBULATORY_CARE_PROVIDER_SITE_OTHER): Payer: Medicare HMO | Admitting: Pediatrics

## 2023-01-02 VITALS — BP 110/72 | HR 76 | Ht 62.99 in | Wt 154.8 lb

## 2023-01-02 DIAGNOSIS — R252 Cramp and spasm: Secondary | ICD-10-CM | POA: Diagnosis not present

## 2023-01-02 DIAGNOSIS — R4189 Other symptoms and signs involving cognitive functions and awareness: Secondary | ICD-10-CM

## 2023-01-02 DIAGNOSIS — R29818 Other symptoms and signs involving the nervous system: Secondary | ICD-10-CM | POA: Diagnosis not present

## 2023-01-02 DIAGNOSIS — I69151 Hemiplegia and hemiparesis following nontraumatic intracerebral hemorrhage affecting right dominant side: Secondary | ICD-10-CM

## 2023-01-02 NOTE — Patient Instructions (Signed)
Talk to [facility] about a PT evaluation for equipment Consider increasing baclofen for spasticity. You can discuss this with Dr. Shearon Stalls.

## 2023-01-20 ENCOUNTER — Encounter (INDEPENDENT_AMBULATORY_CARE_PROVIDER_SITE_OTHER): Payer: Self-pay | Admitting: Pediatrics

## 2023-02-03 ENCOUNTER — Encounter: Payer: Medicare HMO | Attending: Physical Medicine and Rehabilitation | Admitting: Physical Medicine and Rehabilitation

## 2023-02-03 ENCOUNTER — Encounter: Payer: Self-pay | Admitting: Physical Medicine and Rehabilitation

## 2023-02-03 VITALS — BP 112/74 | HR 80 | Ht 62.0 in | Wt 155.0 lb

## 2023-02-03 DIAGNOSIS — Z7409 Other reduced mobility: Secondary | ICD-10-CM

## 2023-02-03 DIAGNOSIS — I69151 Hemiplegia and hemiparesis following nontraumatic intracerebral hemorrhage affecting right dominant side: Secondary | ICD-10-CM

## 2023-02-03 DIAGNOSIS — R252 Cramp and spasm: Secondary | ICD-10-CM | POA: Diagnosis not present

## 2023-02-03 MED ORDER — ONABOTULINUMTOXINA 100 UNITS IJ SOLR
500.0000 [IU] | Freq: Once | INTRAMUSCULAR | Status: AC
  Administered 2023-02-04: 500 [IU] via INTRAMUSCULAR

## 2023-02-03 NOTE — Progress Notes (Signed)
Savannah [place] [name] is a 22 y.o. 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  They are presenting to PM&R clinic  for botox injection.   No changes since last clinic evaluation. Doing well in school, finals coming up. Not good about wearing stretch braces on R hand or foot at nighttime, but has both available in her dorm room. Did well with last injections and does not want them adjusted today, states they last about 2.5 months.      ROS: Denies fevers, chills, N/V, abdominal pain, constipation, diarrhea, SOB, cough, chest pain, new weakness or paraesthesias.       PE: Constitution: Appropriate appearance for age. No apparent distress  Resp: No respiratory distress. No accessory muscle usage. on RA Cardio: Well perfused appearance. No peripheral edema. Abdomen: Nondistended. Nontender.   Psych: Appropriate mood and affect. Neuro: AAOx4. No apparent cognitive deficits. R hemiparesis.  MAS 2 R elbow extensors, MAS 1+ elbow flexors, MAS 3 wrist flexors, MAS 4 finger flexors at DIP and MCP; MAS 3 PIP; Mas 2 thumb flexor , MAS 3 pronator MAS 3 R plantarflexors with knee flexed and extended  MSK:  Slight flexion contractures of distal finger flexors; PIP subluxations with extension PROM R ankle -5  degrees   Botolulinum Toxin Injection: [x]  BOTOX (onabotulinumtoxinA) [_] DYSPORT (abobotulinumtoxinA)  Goals with treatment: [ x] Decrease spasms/ abnormal movements [ x] Improve Active / Passive ROM [x ] Improve ADLs [x ] Improve functional mobility [x ] Improve gait mechanics [x ] Improve positioning/posture [ x] Prevent contracture  [ x] Prevent joint destruction [ ]  Prevent skin breakdown [ ]  Decrease caregiver burden [ x] Improve hygiene [x ] Improve Pain  MEDICATION:  ONAbotulinum toxin. 500 Units    NaCl 5 ccs     CONSENT: Obtained in writing followed by time-out per [facility] policy. Consent uploaded to chart.  Benefits discussed  included, but were not limited to, decreased muscle tightness and spasticity, increased joint range of motion, improved limb positioning and facilitation of hygiene and nursing care.   Risks discussed included, but were not limited to, pain and discomfort, bleeding, bruising, excessive weakness, venous thrombosis, muscle atrophy, and distant spread of toxin which could include generalized muscle weakness, diplopia, blurred vision, ptosis, dysphagia, dysphonia, dysarthria, urinary incontinence and breathing difficulties. These symptoms have been reported hours to weeks after injection. Swallowing and breathing difficulties may be life threatening, and there have been reports of death. Patient/Family member/Guardian/Caregiver have been offered botulinum toxin informational material upon initial consultation and this information has been continually available. All questions answered to patient/family member/guardian/ caregiver satisfaction. They would like to proceed with procedure. There are no noted contraindications to procedure.  PROCEDURE Arly.Keller ] Without Ultrasound: Patient was placed in a position with the appropriate muscles exposed, located and identified. The skin was cleaned with ChloraPrep and ethyl chloride was sprayed for topical anesthetic. Using combination EMG amplification and electrical stimulation, the following muscles were identified using anatomical landmarks described by Perotto, et al (1994) and injected following aspiration to ensure blood vessels were avoided.  [_ ] With Ultrasound: Patient was placed in a position with the appropriate muscles exposed, located and identified. The skin was cleaned with ChloraPrep and ethyl chloride was sprayed for topical anesthetic. Using combination Ultrasound guidance for anatomical guidance, avoidance of significant vasculature, to decrease the risk of hematoma formation and ensure botox was placed in the correct location; EMG amplification and  electrical stimulation,  the following muscles were identified and injected following aspiration to ensure blood vessels were avoided. A _linear transducer was used during the procedure. The botulinum toxin was visualized entering the appropriate musculature.   MUSCLE: Units /Sites Right soleus  100 U - 2 sites Right gastrocnemius 75 U medial / 75 U lateral    Right triceps  75 U - 2 sites Right FDS  75 U Right FCU 25 U Right FCR 25 U Right FPL  25 U Right PT  25 U   500 units were injected without difficulty. No complications were encountered. The patient tolerated the procedure well. Wasted 0  Plan:   - Repeat injections Q3Months as beneficial; will not increase units but may re-distribute based on patient feedback next appointment or adjust to Xeomin if decreasing efficacy  - Educated on importance of compliance with nighttime stretch braces in prevent contractures, wounds, and joint destruction

## 2023-02-03 NOTE — Patient Instructions (Signed)
Repeat injections Q3Months as beneficial; will not increase units but may re-distribute based on patient feedback next appointment

## 2023-03-11 ENCOUNTER — Other Ambulatory Visit: Payer: Self-pay | Admitting: Physical Medicine and Rehabilitation

## 2023-04-02 NOTE — Progress Notes (Incomplete)
Patient: Savannah [name] MRN: [id number] Sex: female DOB: 19-Apr-2000  Provider: Lorenz Coaster, MD Location of Care: [facility]  Note type: Routine follow-up  History of Present Illness:  Savannah [name] is a 23 y.o. 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 Patient was last seen on 01/02/2023 where I recommended discussing PT evaluation for equipment with [facility] and discussing increasing baclofen with Dr. Shearon Stalls.  Since the last appointment, patient saw Dr. Shearon Stalls with PM&R where she did botox injections and discussed importance of nighttime bracing.   Patient presents today with grandmother who reports the following:     Working on Solicitor KAFO covered by [employer]. Already got fitted last week. The new brace will help with climbing stairs and durability. Current brace is painful, unable to wear for long periods of time due to pain. It causes rubbing sores from walking. Currently, uneven terrain is hard to walk on and she has frequent tripping. Not able to up and down stairs. Right knee will overextend and will give out, which is happening more frequently recently.   Following with Dr. Shearon Stalls for botox. Botox going well. Trying to get better with bracing overnight. Still not doing as often as she would like, not really doing it all. Better when she was on break from school. Hard when she gets back to her dorm late at night, class goes until 9 pm.  Not doing any therapies currently. Is able to manage well in her dorm, has her own room with a handicap bathroom. Able to do everything except open her window. Interested in OT to work on skills related to dorm life and PT for when brace comes in.   Diagnostics: MRI of Brain 11/15/21 Impression:  No evidence of recent infarction, hemorrhage,  mass, or abnormal enhancement.   Sequelae of prior cerebral hemorrhage.   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   Interval LEFT frontoparietotemporal craniotomy and cranioplasty.   No acute intracranial abnormalities.   Past Medical History Past Medical History:  Diagnosis Date   Asthma    Stroke [facility])     Surgical History Past Surgical History:  Procedure Laterality Date   BRAIN SURGERY  02/20/2016    Family History family history includes ADD / ADHD in her brother; Bipolar disorder in her father and mother; Depression in her father and mother.   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      IEP in place.      Allergies Allergies  Allergen Reactions   Hydrocodone-Acetaminophen Diarrhea, Nausea And Vomiting and Other (See Comments)    Hallucinations  Other Reaction(s): GI Intolerance, Psychosis   Hydrocodone-Acetaminophen Other (See Comments)    Hallucinations     Medications Current Outpatient Medications on File Prior to Visit  Medication Sig Dispense Refill   acetaminophen (TYLENOL) 500 MG tablet Take by mouth.     albuterol (VENTOLIN HFA) 108 (90 Base) MCG/ACT inhaler Inhale into the lungs.     baclofen (LIORESAL) 10 MG tablet TAKE 1/2 TABLET (5 MG TOTAL) BY MOUTH 3 (THREE) TIMES DAILY. 135 tablet 3   gabapentin (NEURONTIN)  300 MG capsule SMARTSIG:1 Capsule(s) By Mouth Every Evening     hydrOXYzine (VISTARIL) 25 MG capsule Take by mouth.     lamoTRIgine (LAMICTAL) 150 MG tablet Take by mouth.     meclizine (ANTIVERT) 25 MG tablet Take 1 tablet (25 mg total) by mouth 3 (three) times daily as needed for dizziness. 15 tablet 0   montelukast (SINGULAIR) 10 MG tablet Take by mouth.     norethindrone (MICRONOR) 0.35 MG  tablet Take 1 tablet by mouth daily.     PSYLLIUM PO Take by mouth.     No current facility-administered medications on file prior to visit.   The medication list was reviewed and reconciled. All changes or newly prescribed medications were explained.  A complete medication list was provided to the patient/caregiver.  Physical Exam There were no vitals taken for this visit. Facility age limit for growth %iles is 20 years.  No results found.  ***   Diagnosis:No diagnosis found.   Assessment and Plan Savannah [name] is a 23 y.o. 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 Discussed strategies for increasing nighttime bracing including an alarm, wearing it on weekends and days without late classes. Will work on a letter supporting medical necessity of microprocessor KAFO. Referred to OT to work on new skills at school related to dorm life. Discussed PT referral once she has received the new brace to work on goals related to that.   Work on nighttime bracing. Slowly increase hours of bracing on the weekends and when you don't have your night class  We will work on the letter and send it to [employer]. We will also put in an order Referred to OT at [facility] center to work on new skills related to dorm life.   I spent 25 minutes on day of service on this patient including review of chart, discussion with patient and family, discussion of screening results, coordination with other providers and management of orders and paperwork.     No follow-ups on file.  Lorenz Coaster MD MPH Neurology and Neurodevelopment [facility]  [address] [place], [place], [place] [address] Phone: [phone] Fax: [phone]

## 2023-04-07 ENCOUNTER — Ambulatory Visit (INDEPENDENT_AMBULATORY_CARE_PROVIDER_SITE_OTHER): Payer: Medicare HMO | Admitting: Pediatrics

## 2023-04-07 ENCOUNTER — Encounter (INDEPENDENT_AMBULATORY_CARE_PROVIDER_SITE_OTHER): Payer: Self-pay | Admitting: Pediatrics

## 2023-04-07 VITALS — BP 110/70 | HR 80 | Ht 63.39 in | Wt 157.2 lb

## 2023-04-07 DIAGNOSIS — R4189 Other symptoms and signs involving cognitive functions and awareness: Secondary | ICD-10-CM | POA: Diagnosis not present

## 2023-04-07 DIAGNOSIS — R29818 Other symptoms and signs involving the nervous system: Secondary | ICD-10-CM

## 2023-04-07 DIAGNOSIS — I69151 Hemiplegia and hemiparesis following nontraumatic intracerebral hemorrhage affecting right dominant side: Secondary | ICD-10-CM

## 2023-04-07 DIAGNOSIS — Q282 Arteriovenous malformation of cerebral vessels: Secondary | ICD-10-CM

## 2023-04-07 NOTE — Patient Instructions (Addendum)
Work on nighttime bracing. Slowly increase hours of bracing on the weekends and when you don't have your night class  We will work on the letter and send it to [employer]. We will also put in an order Referred to OT at [facility] center to work on new skills related to dorm life. Their number [phone]

## 2023-04-09 ENCOUNTER — Encounter (INDEPENDENT_AMBULATORY_CARE_PROVIDER_SITE_OTHER): Payer: Self-pay

## 2023-04-09 ENCOUNTER — Telehealth (INDEPENDENT_AMBULATORY_CARE_PROVIDER_SITE_OTHER): Payer: Self-pay | Admitting: Pediatrics

## 2023-04-09 NOTE — Telephone Encounter (Signed)
  Name of who is calling: [facility]  Caller's Relationship to Patient:  Best contact number:[phone] fax# [phone]  Provider they see: Dr. Waddell  Reason for call: Calling bc they received a referral for her and she is asking if the notes have been completed yet for this pt, they are in the middle of trying to do authorization and they need notes from the face to face visit that was completed . If you have a note from Dr. Waddell she is requesting that it be faxed over.      PRESCRIPTION REFILL ONLY  Name of prescription:  Pharmacy:

## 2023-04-10 ENCOUNTER — Encounter (INDEPENDENT_AMBULATORY_CARE_PROVIDER_SITE_OTHER): Payer: Self-pay | Admitting: Pediatrics

## 2023-04-14 ENCOUNTER — Encounter (INDEPENDENT_AMBULATORY_CARE_PROVIDER_SITE_OTHER): Payer: Self-pay | Admitting: Pediatrics

## 2023-05-05 ENCOUNTER — Encounter: Payer: Self-pay | Admitting: Physical Medicine and Rehabilitation

## 2023-05-05 ENCOUNTER — Encounter: Payer: Medicare HMO | Attending: Physical Medicine and Rehabilitation | Admitting: Physical Medicine and Rehabilitation

## 2023-05-05 ENCOUNTER — Ambulatory Visit: Payer: Medicare HMO | Admitting: Physical Medicine and Rehabilitation

## 2023-05-05 VITALS — BP 110/70 | HR 72 | Ht 63.3 in | Wt 163.0 lb

## 2023-05-05 DIAGNOSIS — R252 Cramp and spasm: Secondary | ICD-10-CM | POA: Diagnosis present

## 2023-05-05 DIAGNOSIS — I69151 Hemiplegia and hemiparesis following nontraumatic intracerebral hemorrhage affecting right dominant side: Secondary | ICD-10-CM | POA: Insufficient documentation

## 2023-05-05 MED ORDER — ONABOTULINUMTOXINA 100 UNITS IJ SOLR
500.0000 [IU] | Freq: Once | INTRAMUSCULAR | Status: AC
  Administered 2023-05-05: 500 [IU] via INTRAMUSCULAR

## 2023-05-05 NOTE — Progress Notes (Signed)
 Savannah [name] is a 23 y.o. 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  They are presenting to PM&R clinic  for botox injection.   No changes since last clinic evaluation. "Thngs have been going really well; I've been wearing my night brace on my arm and my leg. I can only do about 3 hours on the leg brace before it hurts." She thinks overall the injections from last time worked; she is starting OT back over the summer.   She did need to get her foot brace adjusted with [employer] because of rubbing; they adjusted it and it seems to have helped.      ROS: Denies fevers, chills, N/V, abdominal pain, constipation, diarrhea, SOB, cough, chest pain, new weakness or paraesthesias.       PE: Constitution: Appropriate appearance for age. No apparent distress  Resp: No respiratory distress. No accessory muscle usage. on RA Cardio: Well perfused appearance. No peripheral edema. Abdomen: Nondistended. Nontender.   Psych: Appropriate mood and affect. Neuro: AAOx4. No apparent cognitive deficits. R hemiparesis.  MAS 1+ R elbow extensors, MAS 1 elbow flexors, MAS 3 wrist flexors, MAS 3 finger flexors at DIP and  PIP; Mas 2 and MCP; Mas 1+-2 thumb flexor , MAS 3 pronator  MAS 3-4 R plantarflexors with knee flexed and extended   MSK:  Slight flexion contractures of distal finger flexors; PIP subluxations with extension PROM R ankle 0 to -2 degrees with knee bent, -5  degrees with knee extended   Skin: Healing lacerations on anterior R ankle; otherwise intact   Botolulinum Toxin Injection: [x]  BOTOX (onabotulinumtoxinA) [_] DYSPORT (abobotulinumtoxinA)   Goals with treatment: [ x] Decrease spasms/ abnormal movements [ x] Improve Active / Passive ROM [x ] Improve ADLs [x ] Improve functional mobility [x ] Improve gait mechanics [x ] Improve positioning/posture [ x] Prevent contracture  [ x] Prevent joint destruction [ ]  Prevent skin breakdown [ ]   Decrease caregiver burden [ x] Improve hygiene [x ] Improve Pain   MEDICATION:  ONAbotulinum toxin. 500 Units    NaCl 5 ccs       CONSENT: Obtained in writing followed by time-out per [facility] policy. Consent uploaded to chart.   Benefits discussed included, but were not limited to, decreased muscle tightness and spasticity, increased joint range of motion, improved limb positioning and facilitation of hygiene and nursing care.    Risks discussed included, but were not limited to, pain and discomfort, bleeding, bruising, excessive weakness, venous thrombosis, muscle atrophy, and distant spread of toxin which could include generalized muscle weakness, diplopia, blurred vision, ptosis, dysphagia, dysphonia, dysarthria, urinary incontinence and breathing difficulties. These symptoms have been reported hours to weeks after injection. Swallowing and breathing difficulties may be life threatening, and there have been reports of death. Patient/Family member/Guardian/Caregiver have been offered botulinum toxin informational material upon initial consultation and this information has been continually available. All questions answered to patient/family member/guardian/ caregiver satisfaction. They would like to proceed with procedure. There are no noted contraindications to procedure.   PROCEDURE Arly.Keller ] Without Ultrasound: Patient was placed in a position with the appropriate muscles exposed, located and identified. The skin was cleaned with ChloraPrep and ethyl chloride was sprayed for topical anesthetic. Using combination EMG amplification and electrical stimulation, the following muscles were identified using anatomical landmarks described by Perotto, et al (1994) and injected following aspiration to ensure blood vessels were avoided.   [_ ] With Ultrasound:  Patient was placed in a position with the appropriate muscles exposed, located and identified. The skin was cleaned with ChloraPrep and ethyl chloride was  sprayed for topical anesthetic. Using combination Ultrasound guidance for anatomical guidance, avoidance of significant vasculature, to decrease the risk of hematoma formation and ensure botox was placed in the correct location; EMG amplification and electrical stimulation, the following muscles were identified and injected following aspiration to ensure blood vessels were avoided. A _linear transducer was used during the procedure. The botulinum toxin was visualized entering the appropriate musculature.    MUSCLE: Units /Sites Right soleus  100 U - 2 sites Right gastrocnemius 75 U medial / 75 U lateral  --> 85u/85u  Right triceps  75 U - 2 sites--> 40 U Right FDS  75 U --> 50 U Add FDP 40 U Right FCU 25 U Right FCR 25 U Right FPL  25 U Right PT  25 U     500 units were injected without difficulty. No complications were encountered. The patient tolerated the procedure well. Wasted 0   Plan:   - Repeat injections Q3Months    - Continue nighttime stretch braces in prevent contractures, wounds, and joint destruction. Discussed monitorrin for skin breakdown with RLE brace although this was recently adjusted by [employer]; no apparent unblancheable areas on exam.   - Will consider increasing by 50 U to RUE next time; will eval at visit given good response to last injections

## 2023-07-14 ENCOUNTER — Ambulatory Visit (INDEPENDENT_AMBULATORY_CARE_PROVIDER_SITE_OTHER): Payer: Medicare HMO | Admitting: Pediatrics

## 2023-07-16 ENCOUNTER — Ambulatory Visit: Payer: Medicare HMO | Admitting: Occupational Therapy

## 2023-08-06 ENCOUNTER — Encounter: Attending: Physical Medicine and Rehabilitation | Admitting: Physical Medicine and Rehabilitation

## 2023-08-06 VITALS — BP 113/72 | HR 70 | Ht 63.0 in | Wt 162.0 lb

## 2023-08-06 DIAGNOSIS — R252 Cramp and spasm: Secondary | ICD-10-CM | POA: Diagnosis present

## 2023-08-06 DIAGNOSIS — I69151 Hemiplegia and hemiparesis following nontraumatic intracerebral hemorrhage affecting right dominant side: Secondary | ICD-10-CM | POA: Diagnosis present

## 2023-08-06 MED ORDER — ONABOTULINUMTOXINA 100 UNITS IJ SOLR
500.0000 [IU] | Freq: Once | INTRAMUSCULAR | Status: AC
  Administered 2023-08-06: 500 [IU] via INTRAMUSCULAR

## 2023-08-06 MED ORDER — SODIUM CHLORIDE (PF) 0.9 % IJ SOLN
5.0000 mL | Freq: Once | INTRAMUSCULAR | Status: AC
  Administered 2023-08-06: 5 mL

## 2023-08-06 NOTE — Progress Notes (Signed)
 Savannah [place]  [name] is a 23 y.o. 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  They are presenting to PM&R clinic  for botox  injection.    rolled L ankle recently and just got out of a brace Is resume PT and starting to use her nighttime splint again   ROS: Denies fevers, chills, N/V, abdominal pain, constipation, diarrhea, SOB, cough, chest pain, new weakness or paraesthesias.       PE: Constitution: Appropriate appearance for age. No apparent distress  Resp: No respiratory distress. No accessory muscle usage. on RA Cardio: Well perfused appearance. No peripheral edema. Abdomen: Nondistended. Nontender.   Psych: Appropriate mood and affect. Neuro: AAOx4. No apparent cognitive deficits. R hemiparesis.  MAS 1+ R elbow extensors, MAS 1 elbow flexors, MAS 3 wrist flexors, MAS 3 finger flexors at   PIP, MAS 2 PIP, ; Mas 3 and MCP; Mas 2 thumb flexor , MAS 3 pronator   MAS 3-4 R plantarflexors with knee flexed and extended   MSK:  Slight flexion contractures of distal finger flexors; PIP subluxations with extension PROM R ankle 0 to -2 degrees with knee bent, -5  degrees with knee extended   Skin: Swelling R ankle   Botolulinum Toxin Injection: [x]  BOTOX  (onabotulinumtoxinA ) [_] DYSPORT (abobotulinumtoxinA)   Goals with treatment: [ x] Decrease spasms/ abnormal movements [ x] Improve Active / Passive ROM [x ] Improve ADLs [x ] Improve functional mobility [x ] Improve gait mechanics [x ] Improve positioning/posture [ x] Prevent contracture  [ x] Prevent joint destruction [ ]  Prevent skin breakdown [ ]  Decrease caregiver burden [ x] Improve hygiene [x ] Improve Pain   MEDICATION:  ONAbotulinum toxin. 500 Units    NaCl 5 ccs       CONSENT: Obtained in writing followed by time-out per [facility] policy. Consent uploaded to chart.   Benefits discussed included, but were not limited to, decreased muscle tightness and spasticity, increased  joint range of motion, improved limb positioning and facilitation of hygiene and nursing care.    Risks discussed included, but were not limited to, pain and discomfort, bleeding, bruising, excessive weakness, venous thrombosis, muscle atrophy, and distant spread of toxin which could include generalized muscle weakness, diplopia, blurred vision, ptosis, dysphagia, dysphonia, dysarthria, urinary incontinence and breathing difficulties. These symptoms have been reported hours to weeks after injection. Swallowing and breathing difficulties may be life threatening, and there have been reports of death. Patient/Family member/Guardian/Caregiver have been offered botulinum toxin informational material upon initial consultation and this information has been continually available. All questions answered to patient/family member/guardian/ caregiver satisfaction. They would like to proceed with procedure. There are no noted contraindications to procedure.   PROCEDURE Galerius.Gant ] Without Ultrasound: Patient was placed in a position with the appropriate muscles exposed, located and identified. The skin was cleaned with ChloraPrep and ethyl chloride was sprayed for topical anesthetic. Using combination EMG amplification and electrical stimulation, the following muscles were identified using anatomical landmarks described by Perotto, et al (1994) and injected following aspiration to ensure blood vessels were avoided.   [_ ] With Ultrasound: Patient was placed in a position with the appropriate muscles exposed, located and identified. The skin was cleaned with ChloraPrep and ethyl chloride was sprayed for topical anesthetic. Using combination Ultrasound guidance for anatomical guidance, avoidance of significant vasculature, to decrease the risk of hematoma formation and ensure botox  was placed in the correct location; EMG amplification and electrical stimulation, the  following muscles were identified and injected following  aspiration to ensure blood vessels were avoided. A _linear transducer was used during the procedure. The botulinum toxin was visualized entering the appropriate musculature.    MUSCLE: Units /Sites 250 U RLE Right soleus  100 U - 2 sites Right gastrocnemius 75 U medial / 75 U lateral   250 U RUE Right triceps  75 U - 2 sites--> 40 U Right FDS  75 U --> 50 U --> 70 U Add FDP 40 U  Right FCU 25 U Right FCR 25 U Right FPL  25 U Right PT  25 U     500 units were injected without difficulty. No complications were encountered. The patient tolerated the procedure well. Wasted 0   Plan:   - Repeat injections Q3Months     - Continue nighttime stretch braces in prevent contractures, wounds, and joint destruction. Discussed monitorrin for skin breakdown with RLE brace although this was recently adjusted by [employer]; no apparent unblancheable areas on exam.

## 2023-08-06 NOTE — Patient Instructions (Signed)
-   Repeat injections Q3Months     - Continue nighttime stretch braces in prevent contractures, wounds, and joint destruction. Discussed monitorrin for skin breakdown with RLE brace although this was recently adjusted by [employer]; no apparent unblancheable areas on exam.

## 2023-08-13 ENCOUNTER — Ambulatory Visit: Admitting: Occupational Therapy

## 2023-09-15 ENCOUNTER — Ambulatory Visit: Attending: Physician Assistant | Admitting: Occupational Therapy

## 2023-09-15 ENCOUNTER — Ambulatory Visit

## 2023-09-15 VITALS — BP 109/60 | HR 89

## 2023-09-15 DIAGNOSIS — R278 Other lack of coordination: Secondary | ICD-10-CM | POA: Diagnosis present

## 2023-09-15 DIAGNOSIS — M6281 Muscle weakness (generalized): Secondary | ICD-10-CM | POA: Insufficient documentation

## 2023-09-15 DIAGNOSIS — I69151 Hemiplegia and hemiparesis following nontraumatic intracerebral hemorrhage affecting right dominant side: Secondary | ICD-10-CM | POA: Diagnosis present

## 2023-09-15 DIAGNOSIS — Z7409 Other reduced mobility: Secondary | ICD-10-CM | POA: Diagnosis present

## 2023-09-15 DIAGNOSIS — R29818 Other symptoms and signs involving the nervous system: Secondary | ICD-10-CM | POA: Diagnosis present

## 2023-09-15 DIAGNOSIS — R41840 Attention and concentration deficit: Secondary | ICD-10-CM | POA: Insufficient documentation

## 2023-09-15 DIAGNOSIS — R208 Other disturbances of skin sensation: Secondary | ICD-10-CM | POA: Insufficient documentation

## 2023-09-15 DIAGNOSIS — M25611 Stiffness of right shoulder, not elsewhere classified: Secondary | ICD-10-CM | POA: Diagnosis present

## 2023-09-15 DIAGNOSIS — M25631 Stiffness of right wrist, not elsewhere classified: Secondary | ICD-10-CM | POA: Diagnosis present

## 2023-09-15 DIAGNOSIS — R2681 Unsteadiness on feet: Secondary | ICD-10-CM | POA: Diagnosis present

## 2023-09-15 NOTE — Patient Instructions (Signed)
 [employer] [facility] [employer] [facility] [employer] [facility] [employer] (previously [facility])  [place] [phone]   [id number] [place] [phone]    Local Driver Evaluation Pamphlet provided re:  Comprehensive Evaluation: includes clinical and in vehicle behind the wheel testing by OCCUPATIONAL THERAPIST. Programs have varying levels of adaptive controls available for trial.   [employer], [place] [address] [place], [place]  [address] [phone] or [phone] [email or web address] Evaluator:  Cyndee Crompton, OT/CDRS/CDI/SCDCM/Low Vision Certification

## 2023-09-15 NOTE — Therapy (Signed)
 OUTPATIENT OCCUPATIONAL THERAPY NEURO EVALUATION  Patient Name: Savannah Virginia  [name] MRN: [id number] DOB:14-Apr-2000, 23 y.o., female Today's Date: 09/15/2023  PCP: Pura Lenis, MD REFERRING PROVIDER: Waddell Corean HERO, MD  END OF SESSION:  OT End of Session - 09/15/23 1315     Visit Number 1    Number of Visits 13    Date for OT Re-Evaluation 11/14/23    Authorization Type A[employer] MC [id number]/ [employer] covered @ 100%    OT Start Time 1400    OT Stop Time 1448    OT Time Calculation (min) 48 min    Activity Tolerance Patient tolerated treatment well    Behavior During Therapy WFL for tasks assessed/performed          Past Medical History:  Diagnosis Date   Asthma    Stroke [facility])    Past Surgical History:  Procedure Laterality Date   BRAIN SURGERY  02/20/2016   Patient Active Problem List   Diagnosis Date Noted   Mid back pain on left side 07/17/2022   Sleep difficulties 07/17/2022   MDD (major depressive disorder), recurrent severe, without psychosis (HCC) 08/04/2019   Suicide ideation 08/04/2019   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 (HCC) 02/26/2018   Adjustment disorder 02/26/2018   Spasticity 04/15/2017   Impaired functional mobility, balance, gait, and endurance 07/08/2016   AVM (arteriovenous malformation) brain 05/27/2016   Expressive aphasia 03/26/2016   Hydrocephalus, acquired (HCC) 03/07/2016   Neurocognitive deficits 03/07/2016   Intraparenchymal hematoma of brain (HCC) 02/20/2016   Hypertension 02/20/2016   Bradycardia 02/20/2016   Midline shift of brain due to hematoma (HCC) 02/20/2016    ONSET DATE: 04/07/2023  REFERRING DIAG: P30.848 (ICD-10-CM) - 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  THERAPY DIAG:  Muscle weakness (generalized)  Other lack of coordination  Stiffness of right wrist, not elsewhere classified  Stiffness of right  shoulder, not elsewhere classified  Hemiplegia and hemiparesis following nontraumatic intracerebral hemorrhage affecting right dominant side (HCC)  Other symptoms and signs involving the nervous system  Other disturbances of skin sensation  Rationale for Evaluation and Treatment: Rehabilitation  SUBJECTIVE:   SUBJECTIVE STATEMENT: Pt reports she saw neurologist and they said to work on the arm.  She received Botox  injections ~ 2 weeks ago and gets them avery 3 months for the last 8 years ie) since end of 2017.  She has gotten back into wearing her splint and reported that it was previously made at this facility.      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 Emeline for Botox  going well. Trying to get better with bracing overnight. Still not doing as often as she would like, not really doing it all. Better when she was on break from school. Hard when she gets back to her dorm late at night, class goes until 9 pm.   PRECAUTIONS: Fall  WEIGHT BEARING RESTRICTIONS: No  PAIN:  Are you having pain? Not really but she can't always feel where the hand is, she gets weird feelings/aches throughout the arm more recently since about 1 week after the Botox  injections  FALLS: Has patient fallen in last 6 months? Yes. Number  of falls 1 when she was in a boot on L leg for a sprain x 2 months with AFO on R foot.  LIVING ENVIRONMENT: Lives with: lives with their family and grandma but will be moving back into the dorms at [employer] in a month - has handicap accessible bathroom Lives in: House/apartment Stairs: Yes: External: 7 steps; bilateral Has following equipment  at home: shower chair, Grab bars, and walk in showers  Pt has 2 years remaining in her degree ie) studying studio art - concentration in painting  PLOF: Independent with basic ADLs  PATIENT GOALS: Pt able to report PT goals but none specific to OT although MR review reveals she was interested in OT to work on skills related to dorm life and PT for when brace comes in.  She was interested in exploring appropriate splinting and community resources r/t driving etc.   OBJECTIVE:  Note: Objective measures were completed at Evaluation unless otherwise noted.  HAND DOMINANCE: Previously R handed, now Left  ADLs: Overall ADLs: Pt generally independent with LUE Transfers/ambulation related to ADLs: Mod I with AE Eating: uses fork or knife in L hand Grooming: just brushes hair; gets help to dye her hair UB Dressing: hooks her bra beforehand, can do buttons with L hand LB Dressing: struggling with shoe due to new brace - getting new 'wider' pair of shoes  Toileting: Mod Ind Bathing: Mod Ind Tub Shower transfers: Mod Ind with walk in shower Equipment: Shower seat without back, Grab bars, and Walk in shower  IADLs: Shopping: [employer] - grandmother or friends transport her  Light housekeeping: Mod Ind at dorm Meal Prep: TBA Community mobility: Mod Ind Medication management: Mod Ind at [employer] management: TBA Handwriting: TBA as pt was born R handed and had to transition to LUE s/p ICH Classes are mostly online - she speaks into phone with a google doc to complete most work  MOBILITY STATUS: Independent and Hx of falls  POSTURE COMMENTS:  rounded shoulders and forward head, R hemiplegic UE  Sitting balance: WFL seated in arm chair  ACTIVITY TOLERANCE: Activity tolerance: Good - walks around campus to get to/from classes  FUNCTIONAL OUTCOME MEASURES: TBA: PSFS  UPPER EXTREMITY ROM:    Active ROM Right eval Left eval  Shoulder flexion <90* WNL  Shoulder abduction     Shoulder adduction    Shoulder extension    Shoulder internal rotation    Shoulder external rotation    Elbow flexion Limited   Elbow extension Not full   Wrist flexion    Wrist extension Poor - barely neutral PROM   Wrist ulnar deviation Poor   Wrist radial deviation Poor   Wrist pronation WFL   Wrist supination Poor   (Blank rows = not tested)  UPPER EXTREMITY MMT:     RUE - 2-/5 LUE - 4-5/5  HAND FUNCTION: Grip strength: Right: unable to perform with dynamometer lbs; Left: TBA lbs  COORDINATION: Pt unable to pick up object with R hand unless fully placed in her hand and poor ability to release object  SENSATION: Light touch: Impaired  and unable to identify when OTR touch any of her fingers although she said she can tell when she is touching herself.   EDEMA: NA  MUSCLE TONE: RUE: Moderate  COGNITION: Overall cognitive status: Within functional limits for tasks assessed  VISION: Subjective report: has glasses - wears them to read or do school work - just finsihed 1 summer class and starting and  new one Baseline vision: Wears glasses for reading only Visual history: NA  VISION ASSESSMENT: Not tested  Patient has difficulty with following activities due to following visual impairments: NA  OBSERVATIONS: Pt ambulates with no AD but use of RLE C-brace. No loss of balance. The pt is well kept and has RLE TKFO brace applied donned.                                                                                                                           TODAY'S TREATMENT:    Self Care: OT educated pt on rehabilitation process and results of objective measures in relation to pt specific goals.  Reviewed AE considerations and community resources available for consideration.  Ideas discussed including magnetic jewelry, sipper shoes with inside zipper, one handed keyboard, and even neuro rehab glove for treatment at home.  In addition information provided re: vocational  rehab (per pt instructions) to help with driving assessment and AE considerations for school ie) 1 handed keyboard etc.  Pt also provided brochure form local Driving evaluation program    PATIENT EDUCATION: Education details: OT role, POC considerations and initial AE/community resources (see above) Person educated: Patient and grandmother Education method: Explanation, Verbal cues, and Handouts Education comprehension: verbalized understanding, verbal cues required, and needs further education  HOME EXERCISE PROGRAM: TBD   GOALS: Goals reviewed with patient? Yes  SHORT TERM GOALS: Target date: 10/24/23  Patient will demonstrate initial R UE PROM and LUE coordination HEP with 25% verbal cues or less for proper execution. Baseline: New to outpt OT Goal status: INITIAL   2.  Pt will be independent with splint wear and care for RUE daytime/nighttime with appropriate options available Baseline: custom hand splint at home - increased use lately but not consistent previously Goal status: INITIAL    3.  Pt will recall the 5 main sensory precautions (cold, heat, sharp/breakable, chemical, and heavy) as needed to prevent injury/harm secondary to impairments.  Baseline: New to outpt OT  Goal status: INITIAL   4. Pt will verbalize understanding of adapted strategies and/or equipment PRN to increase safety and independence with ADLs and IADLs.             Baseline: New to outpt OT             Goal Status: INITIAL   LONG TERM GOALS: Target date: 11/28/23   Patient will demonstrate updated BUE HEP with visual handouts only for proper execution - RUE stretching and LUE coordination. Baseline: New to outpt OT  Goal status: INITIAL   2.  Pt will independently recall at least 3 ways to manage spasticity in RUE as needed to prevent pain and maximize overall function.   Baseline: New to outpt OT  Goal Status: INTIAL   3. Pt will verbalize understanding of community resources to assist with  future planning for independent living - housing, work, AE, transportation etc.. Baseline: New to outpt OT s/p 2 years Goal  status: INITIAL   4. Pt will be aware/use sensory stimulation activities to promote max sensory awareness of R hand for engaging hand in bimanual tasks.  Baseline: unable to identify 5/5 RUE fingers touched with vision occluded Goal status: INITIAL   ASSESSMENT:  CLINICAL IMPRESSION: Patient is a 23 y.o. female who was seen today for occupational therapy evaluation for RUE dysfunction due to history ICH. Hx includes HTN, AVM, depression with suicidal ideations, spasticity and R UE contractures. Patient currently presents below baseline level of function with R UE especially demonstrating functional deficits and impairments in strength and coordination, ROM, altered sensation, balance, GM/FM control, and will benefit from skilled OT services to continue education in ADL compensatory strategies/AE prn and progression of an HEP to improve participation and safety during ADLs and IADLS.   PERFORMANCE DEFICITS: in functional skills including ADLs, IADLs, coordination, dexterity, sensation, tone, ROM, strength, fascial restrictions, muscle spasms, flexibility, Fine motor control, Gross motor control, mobility, balance, endurance, decreased knowledge of precautions, decreased knowledge of use of DME, wound, skin integrity, and UE functional use, cognitive skills including problem solving, and psychosocial skills including coping strategies, environmental adaptation, and routines and behaviors.   IMPAIRMENTS: are limiting patient from ADLs, IADLs, rest and sleep, education, work, leisure, and social participation.   CO-MORBIDITIES: may have co-morbidities  that affects occupational performance. Patient will benefit from skilled OT to address above impairments and improve overall function.  MODIFICATION OR ASSISTANCE TO COMPLETE EVALUATION: Min-Moderate modification of tasks or  assist with assess necessary to complete an evaluation.  OT OCCUPATIONAL PROFILE AND HISTORY: Detailed assessment: Review of records and additional review of physical, cognitive, psychosocial history related to current functional performance.  CLINICAL DECISION MAKING: Moderate - several treatment options, min-mod task modification necessary  REHAB POTENTIAL: Good  EVALUATION COMPLEXITY: Moderate    PLAN:  OT FREQUENCY: 1-2x/week  OT DURATION: 8 weeks  PLANNED INTERVENTIONS: 97168 OT Re-evaluation, 97535 self care/ADL training, 02889 therapeutic exercise, 97530 therapeutic activity, 97112 neuromuscular re-education, 97140 manual therapy, 97035 ultrasound, 97032 electrical stimulation (manual), 97760 Orthotic Initial, [id number] Orthotic/Prosthetic subsequent, passive range of motion, balance training, functional mobility training, psychosocial skills training, energy conservation, coping strategies training, patient/family education, and DME and/or AE instructions  RECOMMENDED OTHER SERVICES: NA - PT eval completed on same date  CONSULTED AND AGREED WITH PLAN OF CARE: Patient and family member/caregiver  PLAN FOR NEXT SESSIONS:  PSFS if pt can come up with a couple of ideas + add goal Check splints and review other options ie) RMI, thumb spica NMES and/or neuro glove options Putty HEP for LUE with RUE stabilizing  Community resources (provided [employer] and Driving Eval info at eval) R UE thumb position to promote grasp/stabilization with R hand AE considerations   Clarita LITTIE Pride, OT 09/15/2023, 6:11 PM

## 2023-09-15 NOTE — Therapy (Signed)
 OUTPATIENT PHYSICAL THERAPY NEURO EVALUATION   Patient Name: Savannah Virginia  [name] MRN: [id number] DOB:2000/10/26, 23 y.o., female Today's Date: 09/15/2023   PCP: Alm Bilis, MD REFERRING PROVIDER: Charolet Yvonna SQUIBB, PA-C   END OF SESSION:  PT End of Session - 09/15/23 1317     Visit Number 1    Number of Visits 13    Date for PT Re-Evaluation 11/14/23    Authorization Type Aetna Medicare/ medicaid    PT Start Time 1315    PT Stop Time 1357    PT Time Calculation (min) 42 min    Equipment Utilized During Treatment Gait belt;Other (comment)   c-brace   Activity Tolerance Patient tolerated treatment well    Behavior During Therapy [facility] for tasks assessed/performed          Past Medical History:  Diagnosis Date   Asthma    Stroke [facility])    Past Surgical History:  Procedure Laterality Date   BRAIN SURGERY  02/20/2016   Patient Active Problem List   Diagnosis Date Noted   Mid back pain on left side 07/17/2022   Sleep difficulties 07/17/2022   MDD (major depressive disorder), recurrent severe, without psychosis (HCC) 08/04/2019   Suicide ideation 08/04/2019   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 (HCC) 02/26/2018   Adjustment disorder 02/26/2018   Spasticity 04/15/2017   Impaired functional mobility, balance, gait, and endurance 07/08/2016   AVM (arteriovenous malformation) brain 05/27/2016   Expressive aphasia 03/26/2016   Hydrocephalus, acquired (HCC) 03/07/2016   Neurocognitive deficits 03/07/2016   Intraparenchymal hematoma of brain (HCC) 02/20/2016   Hypertension 02/20/2016   Bradycardia 02/20/2016   Midline shift of brain due to hematoma (HCC) 02/20/2016    ONSET DATE: 08/28/23  REFERRING DIAG: P30.848 (ICD-10-CM) - Hemiplegia and hemiparesis following nontraumatic intracerebral hemorrhage affecting right dominant side   THERAPY DIAG:  Muscle weakness (generalized) - Plan: PT plan of care  cert/re-cert  Unsteadiness on feet - Plan: PT plan of care cert/re-cert  Rationale for Evaluation and Treatment: Rehabilitation  SUBJECTIVE:                                                                                                                                                                                             SUBJECTIVE STATEMENT: Patient arrives to PT with grandmother. She is familiar to this clinic. Roughly a month ago she received an Ott[place]ock C-brace. She has had multiple issues with the brace and currently is experiencing some pain.  Pt accompanied by: self  PERTINENT HISTORY: asthma, CVA  PAIN:  Are you having pain? No  PRECAUTIONS:  Fall  WEIGHT BEARING RESTRICTIONS: No  FALLS: Has patient fallen in last 6 months? Yes. Number of falls 1; fell on [address]  PATIENT GOALS: to learn how to walk in her new brace, stairs, ramps, floor transfers, stumble recovery   OBJECTIVE:  Note: Objective measures were completed at Evaluation unless otherwise noted.  COGNITION: Overall cognitive status: Within functional limits for tasks assessed   POSTURE: No Significant postural limitations   LOWER EXTREMITY MMT:   *assessment of R LE impacted by presence of C-brace   TRANSFERS: Sit to stand: Modified independence and SBA  Assistive device utilized: None     Stand to sit: Modified independence and SBA  Assistive device utilized: None      RAMP:  Findings: shortened L step with decreased time weight bearing on R LE on decline  CURB:  Findings: step up L LE  STAIRS: Findings: Level of Assistance: SBA and CGA, Stair Negotiation Technique: Alternating Pattern  with Single Rail on Right, Number of Stairs: 8, and Height of Stairs: 6    GAIT: Findings: Gait Characteristics: step through pattern, decreased arm swing- Right, decreased step length- Left, decreased stance time- Right, decreased ankle dorsiflexion- Right, Right hip hike, and lateral hip instability,  Distance walked: clinic, Assistive device utilized:None, and Level of assistance: Modified independence and SBA  FUNCTIONAL TESTS:   [facility] PT Assessment - 09/15/23 0001       Standardized Balance Assessment   Standardized Balance Assessment 10 meter walk test    Five times sit to stand comments  26s L UE    10 Meter Walk .98m/s                                                                                                                                      TREATMENT Orthotic fit:  -Delaney present to review mechanics of C-brace  -review of proper fit and function -230' to assess resistance of knee joint   PATIENT EDUCATION: Education details: PT POC, exam findings, build up tolerance to wearing brace Person educated: Patient and Parent Education method: Medical illustrator Education comprehension: verbalized understanding and needs further education  HOME EXERCISE PROGRAM: To be provided  GOALS: Goals reviewed with patient? Yes  SHORT TERM GOALS: Target date: 10/17/23  Pt will be independent with initial HEP for improved proximal strength and balance  Baseline: to be provided  Goal status: INITIAL  2.  Pt will improve 5x STS to </= 20 sec to demo improved functional LE strength and balance   Baseline: 26s + L UE Goal status: INITIAL  3.  Pt will improve gait speed to >/= .8/s to demonstrate improved community ambulation  Baseline: .1m/s Goal status: INITIAL  4.  Patient will negotiate at least 12 steps with reciprocal pattern, HR use as necessary and no more than CGA Baseline: CGA 8 steps Goal status: INITIAL    LONG TERM GOALS: Target  date: 11/14/23  Pt will be independent with final HEP for improved proximal strength and gait mechanics  Baseline: to be provided Goal status: INITIAL  2.  Pt will improve 5x STS to </= 14 sec to demo improved functional LE strength and balance   Baseline: 26s with L UE Goal status: INITIAL  3.  Pt will  improve gait speed to >/= .14m/s to demonstrate improved community ambulation  Baseline: .39m/s Goal status: INITIAL  4.  Patient will negotiate at least 12 steps, reciprocal pattern and HR use as necessary ModI  Baseline: CGA 8 steps Goal status: INITIAL  5.  Patient will both ascend and descend a ramp with LRAD, ModI and equal step lengths B  Baseline: SBA + shortened L step length  Goal status: INITIAL  6.  to be assessed  Baseline: to be assessed Goal status: INITIAL  7. Floor transfer goal  Baseline: to be assessed   Goal status: INITIAL  ASSESSMENT:  CLINICAL IMPRESSION: Patient is a 23 y.o. female who was seen today for physical therapy evaluation and treatment for impaired mobility s/p CVA. She recently received an Ottobock C-Brace and has returned to PT to practice ambulating and traversing community obstacles with it. Five times Sit to Stand Test (FTSS) Method: Use a straight back chair with a solid seat that is 17-18" high. Ask participant to sit on the chair with arms folded across their chest.   Instructions: "Stand up and sit down as quickly as possible 5 times, keeping your arms folded across your chest."   Measurement: Stop timing when the participant touches the chair in sitting the 5th time.  TIME: 26 sec  Cut off scores indicative of increased fall risk: >12 sec CVA, >16 sec PD, >13 sec vestibular (ANPTA Core Set of Outcome Measures for Adults with Neurologic Conditions, 2018). 10 Meter Walk Test: Patient instructed to walk 10 meters (32.8 ft) as quickly and as safely as possible at their normal speed x2 and at a fast speed x2. Time measured from 2 meter mark to 8 meter mark to accommodate ramp-up and ramp-down.  Normal speed: .57m/s Cut off scores: <0.4 m/s = household Ambulator, 0.4-0.8 m/s = limited community Ambulator, >0.8 m/s = community Ambulator, >1.2 m/s = crossing a street, <1.0 = increased fall risk MCID 0.05 m/s (small), 0.13 m/s (moderate),  0.06 m/s (significant)  (ANPTA Core Set of Outcome Measures for Adults with Neurologic Conditions, 2018). She presents with proximal strength deficits in her R LE contributing to the gait impairments listed above. She would benefit from skilled PT services to address the above mentioned deficits.      OBJECTIVE IMPAIRMENTS: Abnormal gait, decreased balance, decreased endurance, decreased knowledge of use of DME, difficulty walking, decreased strength, impaired UE functional use, and pain.   ACTIVITY LIMITATIONS: carrying, bending, squatting, stairs, locomotion level, and caring for others  PARTICIPATION LIMITATIONS: meal prep, cleaning, interpersonal relationship, driving, shopping, community activity, occupation, and school  PERSONAL FACTORS: Age, Past/current experiences, Profession, Social background, Time since onset of injury/illness/exacerbation, and Transportation are also affecting patient's functional outcome.   REHAB POTENTIAL: Fair time since onset  CLINICAL DECISION MAKING: Stable/uncomplicated  EVALUATION COMPLEXITY: Low  PLAN:  PT FREQUENCY: 1-2x/week  PT DURATION: 8 weeks  PLANNED INTERVENTIONS: 97164- PT Re-evaluation, 97750- Physical Performance Testing, 97110-Therapeutic exercises, 97530- Therapeutic activity, [id number]- Neuromuscular re-education, 97535- Self Care, 02859- Manual therapy, [id number]- Gait training, [phone]- Orthotic Initial, [id number]- Orthotic/Prosthetic subsequent, [phone]- Aquatic Therapy, [phone]- Electrical stimulation (manual), Patient/Family education, Balance  training, Stair training, Vestibular training, Visual/preceptual remediation/compensation, and DME instructions  PLAN FOR NEXT SESSION: + goal, floor transfer + goal, HEP, proximal LE strengthening   Delon DELENA Pop, PT Delon DELENA Pop, PT, DPT, CBIS  09/15/2023, 2:47 PM

## 2023-09-19 ENCOUNTER — Ambulatory Visit: Admitting: Occupational Therapy

## 2023-09-22 ENCOUNTER — Ambulatory Visit: Admitting: Occupational Therapy

## 2023-09-22 ENCOUNTER — Ambulatory Visit

## 2023-09-22 DIAGNOSIS — R278 Other lack of coordination: Secondary | ICD-10-CM

## 2023-09-22 DIAGNOSIS — M6281 Muscle weakness (generalized): Secondary | ICD-10-CM

## 2023-09-22 DIAGNOSIS — R208 Other disturbances of skin sensation: Secondary | ICD-10-CM

## 2023-09-22 DIAGNOSIS — I69151 Hemiplegia and hemiparesis following nontraumatic intracerebral hemorrhage affecting right dominant side: Secondary | ICD-10-CM

## 2023-09-22 DIAGNOSIS — Z7409 Other reduced mobility: Secondary | ICD-10-CM

## 2023-09-22 DIAGNOSIS — R2681 Unsteadiness on feet: Secondary | ICD-10-CM

## 2023-09-22 DIAGNOSIS — M25611 Stiffness of right shoulder, not elsewhere classified: Secondary | ICD-10-CM

## 2023-09-22 DIAGNOSIS — M25631 Stiffness of right wrist, not elsewhere classified: Secondary | ICD-10-CM

## 2023-09-22 DIAGNOSIS — R29818 Other symptoms and signs involving the nervous system: Secondary | ICD-10-CM

## 2023-09-22 NOTE — Therapy (Unsigned)
 OUTPATIENT OCCUPATIONAL THERAPY NEURO TREATMENT  Patient Name: Savannah Virginia  [name] MRN: [id number] DOB:August 26, 2000, 23 y.o., female Today's Date: 09/22/2023  PCP: Pura Lenis, MD REFERRING PROVIDER: Waddell Corean HERO, MD  END OF SESSION:  OT End of Session - 09/22/23 1104     Visit Number 2    Number of Visits 13    Date for OT Re-Evaluation 11/14/23    Authorization Type Aetna MC [id number]/ [employer] covered @ 100%    OT Start Time 1104    OT Stop Time 1144    OT Time Calculation (min) 40 min    Activity Tolerance Patient tolerated treatment well    Behavior During Therapy WFL for tasks assessed/performed          Past Medical History:  Diagnosis Date   Asthma    Stroke [facility])    Past Surgical History:  Procedure Laterality Date   BRAIN SURGERY  02/20/2016   Patient Active Problem List   Diagnosis Date Noted   Mid back pain on left side 07/17/2022   Sleep difficulties 07/17/2022   MDD (major depressive disorder), recurrent severe, without psychosis (HCC) 08/04/2019   Suicide ideation 08/04/2019   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 (HCC) 02/26/2018   Adjustment disorder 02/26/2018   Spasticity 04/15/2017   Impaired functional mobility, balance, gait, and endurance 07/08/2016   AVM (arteriovenous malformation) brain 05/27/2016   Expressive aphasia 03/26/2016   Hydrocephalus, acquired (HCC) 03/07/2016   Neurocognitive deficits 03/07/2016   Intraparenchymal hematoma of brain (HCC) 02/20/2016   Hypertension 02/20/2016   Bradycardia 02/20/2016   Midline shift of brain due to hematoma (HCC) 02/20/2016    ONSET DATE: 04/07/2023  REFERRING DIAG: P30.848 (ICD-10-CM) - 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  THERAPY DIAG:  Muscle weakness (generalized)  Other lack of coordination  Stiffness of right wrist, not elsewhere classified  Stiffness of right  shoulder, not elsewhere classified  Hemiplegia and hemiparesis following nontraumatic intracerebral hemorrhage affecting right dominant side (HCC)  Other symptoms and signs involving the nervous system  Other disturbances of skin sensation  Impaired functional mobility, balance, gait, and endurance  Rationale for Evaluation and Treatment: Rehabilitation  SUBJECTIVE:   SUBJECTIVE STATEMENT: Pt reports she saw neurologist and they said to work on the arm.  She received Botox  injections ~ 2 weeks ago and gets them avery 3 months for the last 8 years ie) since end of 2017.  She has gotten back into wearing her splint and reported that it was previously made at this facility.      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 Emeline for Botox  going well. Trying to get better with bracing overnight. Still not doing as often as she would like, not really doing it all. Better when she was on break from school. Hard when she gets back to her dorm late at night, class goes until 9 pm.   PRECAUTIONS: Fall  WEIGHT BEARING RESTRICTIONS: No  PAIN:  Are you having pain? Not really but she can't always feel where the hand is, she gets weird feelings/aches throughout the arm more recently since about 1 week after the Botox  injections  FALLS: Has  patient fallen in last 6 months? Yes. Number of falls 1 when she was in a boot on L leg for a sprain x 2 months with AFO on R foot.  LIVING ENVIRONMENT: Lives with: lives with their family and grandma but will be moving back into the dorms at [employer] in a month - has handicap accessible bathroom Lives in:  House/apartment Stairs: Yes: External: 7 steps; bilateral Has following equipment at home: shower chair, Grab bars, and walk in showers  Pt has 2 years remaining in her degree ie) studying studio art - concentration in painting  PLOF: Independent with basic ADLs  PATIENT GOALS: Pt able to report PT goals but none specific to OT although MR review reveals she was interested in OT to work on skills related to dorm life and PT for when brace comes in.  She was interested in exploring appropriate splinting and community resources r/t driving etc.   OBJECTIVE:  Note: Objective measures were completed at Evaluation unless otherwise noted.  HAND DOMINANCE: Previously R handed, now Left  ADLs: Overall ADLs: Pt generally independent with LUE Transfers/ambulation related to ADLs: Mod I with AE Eating: uses fork or knife in L hand Grooming: just brushes hair; gets help to dye her hair UB Dressing: hooks her bra beforehand, can do buttons with L hand LB Dressing: struggling with shoe due to new brace - getting new 'wider' pair of shoes  Toileting: Mod Ind Bathing: Mod Ind Tub Shower transfers: Mod Ind with walk in shower Equipment: Shower seat without back, Grab bars, and Walk in shower  IADLs: Shopping: [employer] - grandmother or friends transport her  Light housekeeping: Mod Ind at dorm Meal Prep: TBA Community mobility: Mod Ind Medication management: Mod Ind at [employer] management: TBA Handwriting: TBA as pt was born R handed and had to transition to LUE s/p ICH Classes are mostly online - she speaks into phone with a google doc to complete most work  MOBILITY STATUS: Independent and Hx of falls  POSTURE COMMENTS:  rounded shoulders and forward head, R hemiplegic UE  Sitting balance: WFL seated in arm chair  ACTIVITY TOLERANCE: Activity tolerance: Good - walks around campus to get to/from classes  FUNCTIONAL OUTCOME MEASURES: TBA: PSFS  UPPER EXTREMITY ROM:     Active ROM Right eval Left eval  Shoulder flexion <90* WNL  Shoulder abduction    Shoulder adduction    Shoulder extension    Shoulder internal rotation    Shoulder external rotation    Elbow flexion Limited   Elbow extension Not full   Wrist flexion    Wrist extension Poor - barely neutral PROM   Wrist ulnar deviation Poor   Wrist radial deviation Poor   Wrist pronation WFL   Wrist supination Poor   (Blank rows = not tested)  UPPER EXTREMITY MMT:     RUE - 2-/5 LUE - 4-5/5  HAND FUNCTION: Grip strength: Right: unable to perform with dynamometer lbs; Left: TBA lbs  COORDINATION: Pt unable to pick up object with R hand unless fully placed in her hand and poor ability to release object  SENSATION: Light touch: Impaired  and unable to identify when OTR touch any of her fingers although she said she can tell when she is touching herself.   EDEMA: NA  MUSCLE TONE: RUE: Moderate  COGNITION: Overall cognitive status: Within functional limits for tasks assessed  VISION: Subjective report: has glasses - wears them to read or do school work -  just finsihed 1 summer class and starting and new one Baseline vision: Wears glasses for reading only Visual history: NA  VISION ASSESSMENT: Not tested  Patient has difficulty with following activities due to following visual impairments: NA  OBSERVATIONS: Pt ambulates with no AD but use of RLE C-brace. No loss of balance. The pt is well kept and has RLE TKFO brace applied donned.                                                                                                                           TODAY'S TREATMENT:    Self Care: OT educated pt on rehabilitation process and results of objective measures in relation to pt specific goals.  Reviewed AE considerations and community resources available for consideration.  Ideas discussed including magnetic jewelry, sipper shoes with inside zipper, one handed keyboard, and even  neuro rehab glove for treatment at home.  In addition information provided re: vocational rehab (per pt instructions) to help with driving assessment and AE considerations for school ie) 1 handed keyboard etc.  Pt also provided brochure form local Driving evaluation program    PATIENT EDUCATION: Education details: OT role, POC considerations and initial AE/community resources (see above) Person educated: Patient and grandmother Education method: Explanation, Verbal cues, and Handouts Education comprehension: verbalized understanding, verbal cues required, and needs further education  HOME EXERCISE PROGRAM: TBD   GOALS: Goals reviewed with patient? Yes  SHORT TERM GOALS: Target date: 10/24/23  Patient will demonstrate initial R UE PROM and LUE coordination HEP with 25% verbal cues or less for proper execution. Baseline: New to outpt OT Goal status: INITIAL   2.  Pt will be independent with splint wear and care for RUE daytime/nighttime with appropriate options available Baseline: custom hand splint at home - increased use lately but not consistent previously Goal status: INITIAL    3.  Pt will recall the 5 main sensory precautions (cold, heat, sharp/breakable, chemical, and heavy) as needed to prevent injury/harm secondary to impairments.  Baseline: New to outpt OT  Goal status: INITIAL   4. Pt will verbalize understanding of adapted strategies and/or equipment PRN to increase safety and independence with ADLs and IADLs.             Baseline: New to outpt OT             Goal Status: INITIAL   LONG TERM GOALS: Target date: 11/28/23   Patient will demonstrate updated BUE HEP with visual handouts only for proper execution - RUE stretching and LUE coordination. Baseline: New to outpt OT  Goal status: INITIAL   2.  Pt will independently recall at least 3 ways to manage spasticity in RUE as needed to prevent pain and maximize overall function.   Baseline: New to outpt OT  Goal Status:  INTIAL   3. Pt will verbalize understanding of community resources to assist with future planning for independent living - housing, work, AE, transportation etc.. Baseline:  New to outpt OT s/p 2 years Goal status: INITIAL   4. Pt will be aware/use sensory stimulation activities to promote max sensory awareness of R hand for engaging hand in bimanual tasks.  Baseline: unable to identify 5/5 RUE fingers touched with vision occluded Goal status: INITIAL   ASSESSMENT:  CLINICAL IMPRESSION: Patient is a 23 y.o. female who was seen today for occupational therapy evaluation for RUE dysfunction due to history ICH. Hx includes HTN, AVM, depression with suicidal ideations, spasticity and R UE contractures. Patient currently presents below baseline level of function with R UE especially demonstrating functional deficits and impairments in strength and coordination, ROM, altered sensation, balance, GM/FM control, and will benefit from skilled OT services to continue education in ADL compensatory strategies/AE prn and progression of an HEP to improve participation and safety during ADLs and IADLS.   PERFORMANCE DEFICITS: in functional skills including ADLs, IADLs, coordination, dexterity, sensation, tone, ROM, strength, fascial restrictions, muscle spasms, flexibility, Fine motor control, Gross motor control, mobility, balance, endurance, decreased knowledge of precautions, decreased knowledge of use of DME, wound, skin integrity, and UE functional use, cognitive skills including problem solving, and psychosocial skills including coping strategies, environmental adaptation, and routines and behaviors.   IMPAIRMENTS: are limiting patient from ADLs, IADLs, rest and sleep, education, work, leisure, and social participation.   CO-MORBIDITIES: may have co-morbidities  that affects occupational performance. Patient will benefit from skilled OT to address above impairments and improve overall function.  REHAB  POTENTIAL: Good  PLAN:  OT FREQUENCY: 1-2x/week  OT DURATION: 8 weeks  PLANNED INTERVENTIONS: 97168 OT Re-evaluation, 97535 self care/ADL training, 02889 therapeutic exercise, 97530 therapeutic activity, 97112 neuromuscular re-education, 97140 manual therapy, 97035 ultrasound, 97032 electrical stimulation (manual), 97760 Orthotic Initial, [id number] Orthotic/Prosthetic subsequent, passive range of motion, balance training, functional mobility training, psychosocial skills training, energy conservation, coping strategies training, patient/family education, and DME and/or AE instructions  RECOMMENDED OTHER SERVICES: NA - PT eval completed on same date  CONSULTED AND AGREED WITH PLAN OF CARE: Patient and family member/caregiver  PLAN FOR NEXT SESSIONS:  PSFS if pt can come up with a couple of ideas + add goal Check splints and review other options ie) RMI, thumb spica NMES and/or neuro glove options Putty HEP for LUE with RUE stabilizing  Community resources (provided [employer] and Driving Eval info at eval) R UE thumb position to promote grasp/stabilization with R hand AE considerations   Jocelyn CHRISTELLA Bottom, OT 09/22/2023, 3:54 PM

## 2023-09-22 NOTE — Therapy (Signed)
 OUTPATIENT PHYSICAL THERAPY NEURO TREATMENT   Patient Name: Savannah Virginia  [name] MRN: [id number] DOB:05-14-2000, 23 y.o., female Today's Date: 09/22/2023   PCP: Alm Bilis, MD REFERRING PROVIDER: Charolet Yvonna SQUIBB, PA-C   END OF SESSION:  PT End of Session - 09/22/23 1013     Visit Number 2    Number of Visits 13    Date for PT Re-Evaluation 11/14/23    Authorization Type Aetna Medicare/ medicaid    PT Start Time 1015    PT Stop Time 1104    PT Time Calculation (min) 49 min    Equipment Utilized During Treatment Gait belt;Other (comment)   c-brace   Activity Tolerance Patient tolerated treatment well    Behavior During Therapy [facility] for tasks assessed/performed          Past Medical History:  Diagnosis Date   Asthma    Stroke [facility])    Past Surgical History:  Procedure Laterality Date   BRAIN SURGERY  02/20/2016   Patient Active Problem List   Diagnosis Date Noted   Mid back pain on left side 07/17/2022   Sleep difficulties 07/17/2022   MDD (major depressive disorder), recurrent severe, without psychosis (HCC) 08/04/2019   Suicide ideation 08/04/2019   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 (HCC) 02/26/2018   Adjustment disorder 02/26/2018   Spasticity 04/15/2017   Impaired functional mobility, balance, gait, and endurance 07/08/2016   AVM (arteriovenous malformation) brain 05/27/2016   Expressive aphasia 03/26/2016   Hydrocephalus, acquired (HCC) 03/07/2016   Neurocognitive deficits 03/07/2016   Intraparenchymal hematoma of brain (HCC) 02/20/2016   Hypertension 02/20/2016   Bradycardia 02/20/2016   Midline shift of brain due to hematoma (HCC) 02/20/2016    ONSET DATE: 08/28/23  REFERRING DIAG: P30.848 (ICD-10-CM) - Hemiplegia and hemiparesis following nontraumatic intracerebral hemorrhage affecting right dominant side   THERAPY DIAG:  Muscle weakness (generalized)  Unsteadiness on feet  Other  lack of coordination  Rationale for Evaluation and Treatment: Rehabilitation  SUBJECTIVE:                                                                                                                                                                                             SUBJECTIVE STATEMENT: Patient arrives to clinic wearing C- Brace. Wore the brace 5 hours Fri and Sat. Did have 2 stumbles where brace engaged. Does have a small blister/cut to post R heel- PT contacted orthotist re: this. Denies falls.  Pt accompanied by: self  PERTINENT HISTORY: asthma, CVA  PAIN:  Are you having pain? No  PRECAUTIONS: Fall  PATIENT GOALS: to learn  how to walk in her new brace, stairs, ramps, floor transfers, stumble recovery                                                                                                                              TREATMENT Orthotic subsequent:  -seated march over kettlebell  -progressed to resisted seated marches with emphasis on neutral R hip positioning  -anterior lunge against resistance of C- brace -lateral step lunge against resistance of C-brace  -progressed to forced anterior tib translation in prep for terminal stance/swing phase on downward incline   Gait:  -treadmill with U UE support up to 1. working up to 6% incline for total of 8 mins  -x2 mins treadmill ball kicks for emphasis on full terminal swing and equal step length    PATIENT EDUCATION: Education details: f/u with orthotist re: blister on heel, build up tolerance to gait with brace on  Person educated: Patient and Parent Education method: Medical illustrator Education comprehension: verbalized understanding and needs further education  HOME EXERCISE PROGRAM: Access Code: [email or web address] URL: [email or web address] Date: 09/22/2023 Prepared by: Delon Pop  Exercises - Seated March With Resistance at Feet  - 1 x daily - 7 x weekly - 3 sets - 10  reps  GOALS: Goals reviewed with patient? Yes  SHORT TERM GOALS: Target date: 10/17/23  Pt will be independent with initial HEP for improved proximal strength and balance  Baseline: to be provided  Goal status: INITIAL  2.  Pt will improve 5x STS to </= 20 sec to demo improved functional LE strength and balance   Baseline: 26s + L UE Goal status: INITIAL  3.  Pt will improve gait speed to >/= .8/s to demonstrate improved community ambulation  Baseline: .79m/s Goal status: INITIAL  4.  Patient will negotiate at least 12 steps with reciprocal pattern, HR use as necessary and no more than CGA Baseline: CGA 8 steps Goal status: INITIAL    LONG TERM GOALS: Target date: 11/14/23  Pt will be independent with final HEP for improved proximal strength and gait mechanics  Baseline: to be provided Goal status: INITIAL  2.  Pt will improve 5x STS to </= 14 sec to demo improved functional LE strength and balance   Baseline: 26s with L UE Goal status: INITIAL  3.  Pt will improve gait speed to >/= .8m/s to demonstrate improved community ambulation  Baseline: .16m/s Goal status: INITIAL  4.  Patient will negotiate at least 12 steps, reciprocal pattern and HR use as necessary ModI  Baseline: CGA 8 steps Goal status: INITIAL  5.  Patient will both ascend and descend a ramp with LRAD, ModI and equal step lengths B  Baseline: SBA + shortened L step length  Goal status: INITIAL  6.  to be assessed  Baseline: to be assessed Goal status: INITIAL  7. Floor transfer goal  Baseline: to be assessed   Goal status: INITIAL  ASSESSMENT:  CLINICAL IMPRESSION: Patient seen for skilled PT session with emphasis on C-brace training in context of gait. Self-selects shortened R stance time resulting in shortened L step length. Habitual R hip hike noted due to learned decrease of R knee flexion through swing phase. Receptive to feed back to increase R knee flexion through swing phase to  minimize compensatory strategies and decrease gait deficits contributing to instability. With minimal dual tasking, does revert back to habitual gait pattern. Continue POC.   OBJECTIVE IMPAIRMENTS: Abnormal gait, decreased balance, decreased endurance, decreased knowledge of use of DME, difficulty walking, decreased strength, impaired UE functional use, and pain.   ACTIVITY LIMITATIONS: carrying, bending, squatting, stairs, locomotion level, and caring for others  PARTICIPATION LIMITATIONS: meal prep, cleaning, interpersonal relationship, driving, shopping, community activity, occupation, and school  PERSONAL FACTORS: Age, Past/current experiences, Profession, Social background, Time since onset of injury/illness/exacerbation, and Transportation are also affecting patient's functional outcome.   REHAB POTENTIAL: Fair time since onset  CLINICAL DECISION MAKING: Stable/uncomplicated  EVALUATION COMPLEXITY: Low  PLAN:  PT FREQUENCY: 1-2x/week  PT DURATION: 8 weeks  PLANNED INTERVENTIONS: 97164- PT Re-evaluation, 97750- Physical Performance Testing, 97110-Therapeutic exercises, 97530- Therapeutic activity, [id number]- Neuromuscular re-education, 97535- Self Care, 02859- Manual therapy, [phone]- Gait training, [phone]- Orthotic Initial, [phone]- Orthotic/Prosthetic subsequent, [phone]- Aquatic Therapy, [phone]- Electrical stimulation (manual), Patient/Family education, Balance training, Stair training, Vestibular training, Visual/preceptual remediation/compensation, and DME instructions  PLAN FOR NEXT SESSION: + goal, floor transfer + goal, HEP, proximal LE strengthening, ramp, stairs   Delon DELENA Pop, PT Delon DELENA Pop, PT, DPT, CBIS  09/22/2023, 11:26 AM

## 2023-09-24 ENCOUNTER — Ambulatory Visit

## 2023-09-24 DIAGNOSIS — R2681 Unsteadiness on feet: Secondary | ICD-10-CM

## 2023-09-24 DIAGNOSIS — M6281 Muscle weakness (generalized): Secondary | ICD-10-CM

## 2023-09-24 DIAGNOSIS — R278 Other lack of coordination: Secondary | ICD-10-CM

## 2023-09-24 NOTE — Therapy (Signed)
 OUTPATIENT PHYSICAL THERAPY NEURO TREATMENT   Patient Name: Savannah Virginia  [name] MRN: [id number] DOB:[birth year], 23 y.o., female Today's Date: 09/24/2023   PCP: Alm Bilis, MD REFERRING PROVIDER: Charolet Yvonna SQUIBB, PA-C   END OF SESSION:  PT End of Session - 09/24/23 1051     Visit Number 3    Number of Visits 13    Date for PT Re-Evaluation 11/14/23    Authorization Type [employer] Medicare/ medicaid    PT Start Time 1100    PT Stop Time 1145    PT Time Calculation (min) 45 min    Equipment Utilized During Treatment Gait belt;Other (comment)   c-brace   Activity Tolerance Patient tolerated treatment well    Behavior During Therapy [facility] for tasks assessed/performed          Past Medical History:  Diagnosis Date   Asthma    Stroke [facility])    Past Surgical History:  Procedure Laterality Date   BRAIN SURGERY  02/20/2016   Patient Active Problem List   Diagnosis Date Noted   Mid back pain on left side 07/17/2022   Sleep difficulties 07/17/2022   MDD (major depressive disorder), recurrent severe, without psychosis (HCC) 08/04/2019   Suicide ideation 08/04/2019   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 (HCC) 02/26/2018   Adjustment disorder 02/26/2018   Spasticity 04/15/2017   Impaired functional mobility, balance, gait, and endurance 07/08/2016   AVM (arteriovenous malformation) brain 05/27/2016   Expressive aphasia 03/26/2016   Hydrocephalus, acquired (HCC) 03/07/2016   Neurocognitive deficits 03/07/2016   Intraparenchymal hematoma of brain (HCC) 02/20/2016   Hypertension 02/20/2016   Bradycardia 02/20/2016   Midline shift of brain due to hematoma (HCC) 02/20/2016    ONSET DATE: 08/28/23  REFERRING DIAG: P30.848 (ICD-10-CM) - Hemiplegia and hemiparesis following nontraumatic intracerebral hemorrhage affecting right dominant side   THERAPY DIAG:  Muscle weakness (generalized)  Other lack of  coordination  Unsteadiness on feet  Rationale for Evaluation and Treatment: Rehabilitation  SUBJECTIVE:                                                                                                                                                                                             SUBJECTIVE STATEMENT: Patient arrives to clinic in C- brace. Did get a new SMO to minimize blistering on R heel. Now with softer rubber-like material that comes up higher on heel- does still rub a little. Only received new SMO last night at 630 so has not worn it a lot. Denies falls.  Pt accompanied by: self  PERTINENT HISTORY: asthma, CVA  PAIN:  Are you having pain? No  PRECAUTIONS: Fall  PATIENT GOALS: to learn how to walk in her new brace, stairs, ramps, floor transfers, stumble recovery                                                                                                                              TREATMENT Theract: - : 1155ft with no AD, CGA (C-brace donned) -floor transfer with MinA + c-brace donned -extensive education re: fall recovery with review of which leg to power up with, brace donned vs doffed, how to direct friends/family/community members to assist as needed  -patient requires use of UE on solid surface to assist up to standing at this time   PATIENT EDUCATION: Education details: exam results + see above, continue HEP Person educated: Patient and Parent Education method: Medical illustrator Education comprehension: verbalized understanding and needs further education  HOME EXERCISE PROGRAM: Access Code: [email or web address] URL: [email or web address] Date: 09/22/2023 Prepared by: Delon Pop  Exercises - Seated March With Resistance at Feet  - 1 x daily - 7 x weekly - 3 sets - 10 reps  -donning/doffing c-brace in sitting vs long sitting on bed   GOALS: Goals reviewed with patient? Yes  SHORT TERM GOALS: Target date: 10/17/23  Pt will  be independent with initial HEP for improved proximal strength and balance  Baseline: to be provided  Goal status: INITIAL  2.  Pt will improve 5x STS to </= 20 sec to demo improved functional LE strength and balance   Baseline: 26s + L UE Goal status: INITIAL  3.  Pt will improve gait speed to >/= .8/s to demonstrate improved community ambulation  Baseline: .17m/s Goal status: INITIAL  4.  Patient will negotiate at least 12 steps with reciprocal pattern, HR use as necessary and no more than CGA Baseline: CGA 8 steps Goal status: INITIAL    LONG TERM GOALS: Target date: 11/14/23  Pt will be independent with final HEP for improved proximal strength and gait mechanics  Baseline: to be provided Goal status: INITIAL  2.  Pt will improve 5x STS to </= 14 sec to demo improved functional LE strength and balance   Baseline: 26s with L UE Goal status: INITIAL  3.  Pt will improve gait speed to >/= .83m/s to demonstrate improved community ambulation  Baseline: .65m/s Goal status: INITIAL  4.  Patient will negotiate at least 12 steps, reciprocal pattern and HR use as necessary ModI  Baseline: CGA 8 steps Goal status: INITIAL  5.  Patient will both ascend and descend a ramp with LRAD, ModI and equal step lengths B  Baseline: SBA + shortened L step length  Goal status: INITIAL  6.  Patient will ambulate >/=1529ft during to demonstrate improved community ambulation endurance Baseline: 1151ft with no AD + CGA and C-brace Goal status: REVISED  7. Patient will be able to complete floor > stand transfer with no more than CGA and no UE  support to simulate a fall in the community and recovery   Baseline: MinA + L UE support on mat  Goal status: REVISED  ASSESSMENT:  CLINICAL IMPRESSION: Patient seen for skilled PT session with emphasis on assessment and floor transfer practice. 6 Min Walk Test:  Instructed patient to ambulate as quickly and as safely as possible for 6  minutes using LRAD. Patient was allowed to take standing rest breaks without stopping the test, but if the patient required a sitting rest break the clock would be stopped and the test would be over.  Results: 1175 feet with CGA and C-brace. Results indicate that the patient has reduced endurance with ambulation compared to age matched norms.  Age Matched Norms: 80-69 yo M: 4 F: 97, 2-79 yo M: 64 F: 471, 71-89 yo M: 417 F: 392 MDC: 58.21 meters (190.98 feet) or 50 meters (ANPTA Core Set of Outcome Measures for Adults with Neurologic Conditions, 2018). Patient currently only able to complete floor -> stand transfer with L LE up in half kneel position, using L UE on mat table and MinA. Ultimately, patient would need to be able to complete floor > stand transfer with no UE support on a surface to simulate fall recovery in a community environment, such as at college. Continue POC.    OBJECTIVE IMPAIRMENTS: Abnormal gait, decreased balance, decreased endurance, decreased knowledge of use of DME, difficulty walking, decreased strength, impaired UE functional use, and pain.   ACTIVITY LIMITATIONS: carrying, bending, squatting, stairs, locomotion level, and caring for others  PARTICIPATION LIMITATIONS: meal prep, cleaning, interpersonal relationship, driving, shopping, community activity, occupation, and school  PERSONAL FACTORS: Age, Past/current experiences, Profession, Social background, Time since onset of injury/illness/exacerbation, and Transportation are also affecting patient's functional outcome.   REHAB POTENTIAL: Fair time since onset  CLINICAL DECISION MAKING: Stable/uncomplicated  EVALUATION COMPLEXITY: Low  PLAN:  PT FREQUENCY: 1-2x/week  PT DURATION: 8 weeks  PLANNED INTERVENTIONS: 97164- PT Re-evaluation, 97750- Physical Performance Testing, 97110-Therapeutic exercises, 97530- Therapeutic activity, [id number]- Neuromuscular re-education, 97535- Self Care, [id number]- Manual therapy,  [id number]- Gait training, [phone]- Orthotic Initial, [phone]- Orthotic/Prosthetic subsequent, [phone]- Aquatic Therapy, [phone]- Electrical stimulation (manual), Patient/Family education, Balance training, Stair training, Vestibular training, Visual/preceptual remediation/compensation, and DME instructions  PLAN FOR NEXT SESSION: tall kneel-> half kneel transition, balance in those positions, blaze pods? Ultimately working toward floor transfer with no UE support, HEP, proximal LE strengthening, ramp, stairs   Delon DELENA Pop, PT Delon DELENA Pop, PT, DPT, CBIS  09/24/2023, 12:07 PM

## 2023-09-25 ENCOUNTER — Ambulatory Visit

## 2023-09-25 ENCOUNTER — Encounter: Admitting: Occupational Therapy

## 2023-09-30 ENCOUNTER — Telehealth: Payer: Self-pay | Admitting: Occupational Therapy

## 2023-09-30 ENCOUNTER — Encounter: Payer: Self-pay | Admitting: Physical Therapy

## 2023-09-30 ENCOUNTER — Ambulatory Visit: Admitting: Physical Therapy

## 2023-09-30 ENCOUNTER — Ambulatory Visit: Admitting: Occupational Therapy

## 2023-09-30 DIAGNOSIS — R29818 Other symptoms and signs involving the nervous system: Secondary | ICD-10-CM

## 2023-09-30 DIAGNOSIS — M25631 Stiffness of right wrist, not elsewhere classified: Secondary | ICD-10-CM

## 2023-09-30 DIAGNOSIS — M6281 Muscle weakness (generalized): Secondary | ICD-10-CM | POA: Diagnosis not present

## 2023-09-30 DIAGNOSIS — I69151 Hemiplegia and hemiparesis following nontraumatic intracerebral hemorrhage affecting right dominant side: Secondary | ICD-10-CM

## 2023-09-30 DIAGNOSIS — R208 Other disturbances of skin sensation: Secondary | ICD-10-CM

## 2023-09-30 DIAGNOSIS — R278 Other lack of coordination: Secondary | ICD-10-CM

## 2023-09-30 DIAGNOSIS — R2681 Unsteadiness on feet: Secondary | ICD-10-CM

## 2023-09-30 DIAGNOSIS — Z7409 Other reduced mobility: Secondary | ICD-10-CM

## 2023-09-30 DIAGNOSIS — R41840 Attention and concentration deficit: Secondary | ICD-10-CM

## 2023-09-30 DIAGNOSIS — M25611 Stiffness of right shoulder, not elsewhere classified: Secondary | ICD-10-CM

## 2023-09-30 NOTE — Therapy (Signed)
 OUTPATIENT PHYSICAL THERAPY NEURO TREATMENT   Patient Name: Savannah Virginia  [name] MRN: [id number] DOB:[birth year], 23 y.o., female Today's Date: 09/30/2023   PCP: Alm Bilis, MD REFERRING PROVIDER: Charolet Yvonna SQUIBB, PA-C   END OF SESSION:  PT End of Session - 09/30/23 1534     Visit Number 4    Number of Visits 13    Date for PT Re-Evaluation 11/14/23    Authorization Type [employer] Medicare/ medicaid    PT Start Time 1532    PT Stop Time 1615    PT Time Calculation (min) 43 min    Equipment Utilized During Treatment Gait belt;Other (comment)   c-brace   Activity Tolerance Patient tolerated treatment well    Behavior During Therapy [facility] for tasks assessed/performed          Past Medical History:  Diagnosis Date   Asthma    Stroke [facility])    Past Surgical History:  Procedure Laterality Date   BRAIN SURGERY  02/20/2016   Patient Active Problem List   Diagnosis Date Noted   Mid back pain on left side 07/17/2022   Sleep difficulties 07/17/2022   MDD (major depressive disorder), recurrent severe, without psychosis (HCC) 08/04/2019   Suicide ideation 08/04/2019   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 (HCC) 02/26/2018   Adjustment disorder 02/26/2018   Spasticity 04/15/2017   Impaired functional mobility, balance, gait, and endurance 07/08/2016   AVM (arteriovenous malformation) brain 05/27/2016   Expressive aphasia 03/26/2016   Hydrocephalus, acquired (HCC) 03/07/2016   Neurocognitive deficits 03/07/2016   Intraparenchymal hematoma of brain (HCC) 02/20/2016   Hypertension 02/20/2016   Bradycardia 02/20/2016   Midline shift of brain due to hematoma (HCC) 02/20/2016    ONSET DATE: 08/28/23  REFERRING DIAG: P30.848 (ICD-10-CM) - Hemiplegia and hemiparesis following nontraumatic intracerebral hemorrhage affecting right dominant side   THERAPY DIAG:  Muscle weakness (generalized)  Other lack of  coordination  Unsteadiness on feet  Hemiplegia and hemiparesis following nontraumatic intracerebral hemorrhage affecting right dominant side (HCC)  Other symptoms and signs involving the nervous system  Other disturbances of skin sensation  Rationale for Evaluation and Treatment: Rehabilitation  SUBJECTIVE:                                                                                                                                                                                             SUBJECTIVE STATEMENT: Patient arrives to clinic in C- brace. She reports her R heel is doing better in regards to blistering today.  Denies falls.  Pt accompanied by: self  PERTINENT HISTORY: asthma, CVA  PAIN:  Are you having pain? No  PRECAUTIONS: Fall  PATIENT GOALS: to learn how to walk in her new brace, stairs, ramps, floor transfers, stumble recovery                                                                                                                              TREATMENT NMR: SBA w/ cues for sequencing into tall kneel from standing using mat table support - minA to position RLE once L knee on ground  In tall kneel: -attempted heel sits but C brace will not allow ROM -3.3lb wt ball floor to overhead w/ RUE x10 > crossbody diagonals w/ 3.3 lb ball x12 -Began practicing transition to half kneel w/ left knee on ground, pt intolerant to position so took seated recovery x3 minutes, mild redness to bilateral knees that resolves w/ rest  In half kneel (pillow under knees for comfort): -Half knee hold unsupported each LE forward several seconds SBA, more wobble when R knee in stance -L forward x10 RUE forward reach outside BOS -R forward x10 RUE forward reach outside BOS -Switching half kneel position L and R w/o UE support SBA-CGA - discussed and returned demo of ways to perform swing into half kneel w/ RLE in parts and even using ground support if ever in open environment but  acknowledged that quadruped to swing into tall kneel can be harder to obtain from balance and strength standpoint, pt verbalizes understanding  -Stairs x4 SBA using L rail only, she is able to intermittently using reciprocal pattern up the stairs, more consistent with this on descent - discussed safety of step to pattern in general, but goal of working towards reciprocal with brace -6 RLE step taps to bottom step only x14 > sequence of bottom and second step focused on hip flexion needed to return from top step x10  PATIENT EDUCATION: Education details: See above, continue HEP Person educated: Patient and Parent Education method: Explanation and Demonstration Education comprehension: verbalized understanding and needs further education  HOME EXERCISE PROGRAM: Access Code: [email or web address] URL: [email or web address] Date: 09/22/2023 Prepared by: Delon Pop  Exercises - Seated March With Resistance at Feet  - 1 x daily - 7 x weekly - 3 sets - 10 reps  -donning/doffing c-brace in sitting vs long sitting on bed   GOALS: Goals reviewed with patient? Yes  SHORT TERM GOALS: Target date: 10/17/23  Pt will be independent with initial HEP for improved proximal strength and balance  Baseline: to be provided  Goal status: INITIAL  2.  Pt will improve 5x STS to </= 20 sec to demo improved functional LE strength and balance   Baseline: 26s + L UE Goal status: INITIAL  3.  Pt will improve gait speed to >/= .8/s to demonstrate improved community ambulation  Baseline: .53m/s Goal status: INITIAL  4.  Patient will negotiate at least 12 steps with reciprocal pattern, HR use as necessary and no more than CGA Baseline: CGA  8 steps Goal status: INITIAL    LONG TERM GOALS: Target date: 11/14/23  Pt will be independent with final HEP for improved proximal strength and gait mechanics  Baseline: to be provided Goal status: INITIAL  2.  Pt will improve 5x STS to </= 14 sec to demo  improved functional LE strength and balance   Baseline: 26s with L UE Goal status: INITIAL  3.  Pt will improve gait speed to >/= .84m/s to demonstrate improved community ambulation  Baseline: .70m/s Goal status: INITIAL  4.  Patient will negotiate at least 12 steps, reciprocal pattern and HR use as necessary ModI  Baseline: CGA 8 steps Goal status: INITIAL  5.  Patient will both ascend and descend a ramp with LRAD, ModI and equal step lengths B  Baseline: SBA + shortened L step length  Goal status: INITIAL  6.  Patient will ambulate >/=1589ft during to demonstrate improved community ambulation endurance Baseline: 1139ft with no AD + CGA and C-brace Goal status: REVISED  7. Patient will be able to complete floor > stand transfer with no more than CGA and no UE support to simulate a fall in the community and recovery   Baseline: MinA + L UE support on mat  Goal status: REVISED  ASSESSMENT:  CLINICAL IMPRESSION: Focus of skilled session today on NMR to improve RLE motor control and feedback using tall and half kneel transitions.  She has difficulty w/ RLE in stance in half kneel, but improves greatly with repetition.  Ended session with step taps to challenge hip flexor control with pt able to progress to second step (~12 height).  She continues to benefit from skilled PT to address RLE NMR and improve use of C Brace componentry.  Continue POC.    OBJECTIVE IMPAIRMENTS: Abnormal gait, decreased balance, decreased endurance, decreased knowledge of use of DME, difficulty walking, decreased strength, impaired UE functional use, and pain.   ACTIVITY LIMITATIONS: carrying, bending, squatting, stairs, locomotion level, and caring for others  PARTICIPATION LIMITATIONS: meal prep, cleaning, interpersonal relationship, driving, shopping, community activity, occupation, and school  PERSONAL FACTORS: Age, Past/current experiences, Profession, Social background, Time since onset of  injury/illness/exacerbation, and Transportation are also affecting patient's functional outcome.   REHAB POTENTIAL: Fair time since onset  CLINICAL DECISION MAKING: Stable/uncomplicated  EVALUATION COMPLEXITY: Low  PLAN:  PT FREQUENCY: 1-2x/week  PT DURATION: 8 weeks  PLANNED INTERVENTIONS: 97164- PT Re-evaluation, 97750- Physical Performance Testing, 97110-Therapeutic exercises, 97530- Therapeutic activity, [id number]- Neuromuscular re-education, 97535- Self Care, 02859- Manual therapy, [id number]- Gait training, [phone]- Orthotic Initial, [phone]- Orthotic/Prosthetic subsequent, [phone]- Aquatic Therapy, [phone]- Electrical stimulation (manual), Patient/Family education, Balance training, Stair training, Vestibular training, Visual/preceptual remediation/compensation, and DME instructions  PLAN FOR NEXT SESSION: tall kneel-> half kneel transition, balance in those positions, blaze pods? Ultimately working toward floor transfer with no UE support, HEP, proximal LE strengthening, ramp, stairs   Daved KATHEE Bull, PT, DPT  09/30/2023, 4:23 PM

## 2023-09-30 NOTE — Patient Instructions (Signed)
 Access Code: [email or web address] URL: [email or web address]

## 2023-09-30 NOTE — Therapy (Signed)
 OUTPATIENT OCCUPATIONAL THERAPY NEURO TREATMENT  Patient Name: Savannah Virginia  [name] MRN: [id number] DOB:05-11-2000, 23 y.o., female Today's Date: 09/30/2023  PCP: Pura Lenis, MD REFERRING PROVIDER: Waddell Corean HERO, MD  END OF SESSION:  OT End of Session - 09/30/23 1619     Visit Number 3    Number of Visits 13    Date for OT Re-Evaluation 11/14/23    Authorization Type Aetna MC [id number]/ [employer] covered @ 100%    OT Start Time 1618    OT Stop Time 1656    OT Time Calculation (min) 38 min    Activity Tolerance Patient tolerated treatment well    Behavior During Therapy WFL for tasks assessed/performed          Past Medical History:  Diagnosis Date   Asthma    Stroke [facility])    Past Surgical History:  Procedure Laterality Date   BRAIN SURGERY  02/20/2016   Patient Active Problem List   Diagnosis Date Noted   Mid back pain on left side 07/17/2022   Sleep difficulties 07/17/2022   MDD (major depressive disorder), recurrent severe, without psychosis (HCC) 08/04/2019   Suicide ideation 08/04/2019   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 (HCC) 02/26/2018   Adjustment disorder 02/26/2018   Spasticity 04/15/2017   Impaired functional mobility, balance, gait, and endurance 07/08/2016   AVM (arteriovenous malformation) brain 05/27/2016   Expressive aphasia 03/26/2016   Hydrocephalus, acquired (HCC) 03/07/2016   Neurocognitive deficits 03/07/2016   Intraparenchymal hematoma of brain (HCC) 02/20/2016   Hypertension 02/20/2016   Bradycardia 02/20/2016   Midline shift of brain due to hematoma (HCC) 02/20/2016    ONSET DATE: 04/07/2023  REFERRING DIAG: P30.848 (ICD-10-CM) - 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  THERAPY DIAG:  Muscle weakness (generalized)  Other lack of coordination  Stiffness of right wrist, not elsewhere classified  Stiffness of right  shoulder, not elsewhere classified  Hemiplegia and hemiparesis following nontraumatic intracerebral hemorrhage affecting right dominant side (HCC)  Other symptoms and signs involving the nervous system  Other disturbances of skin sensation  Attention and concentration deficit  Rationale for Evaluation and Treatment: Rehabilitation  SUBJECTIVE:   SUBJECTIVE STATEMENT: Pt reports she has a fabricated resting hand splint from 2020 but it is not comfortable to sleep in. She only wears the splint up to ~3 hours a day.  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 Emeline for Botox  going well. Trying to get better with bracing overnight. Still not doing as often as she would like, not really doing it all. Better when she was on break from school. Hard when she gets back to her dorm late at night, class goes until 9 pm.   PRECAUTIONS: Fall  WEIGHT BEARING RESTRICTIONS: No  PAIN:  Are you having pain? Not really but she can't always feel where the hand is, she gets weird feelings/aches throughout the arm more recently since about 1 week after the Botox  injections  FALLS: Has patient fallen in last 6 months? Yes. Number of falls 1 when she was in a boot on L leg for a sprain x 2 months with AFO on R foot.  LIVING ENVIRONMENT:  Lives with: lives with their family and grandma but will be moving back into the dorms at [employer] in a month - has handicap accessible bathroom Lives in: House/apartment Stairs: Yes: External: 7 steps; bilateral Has following equipment at home: shower chair, Grab bars, and walk in showers  Pt has 2 years remaining in her degree ie)  studying studio art - concentration in painting  PLOF: Independent with basic ADLs  PATIENT GOALS: Pt able to report PT goals but none specific to OT although MR review reveals she was interested in OT to work on skills related to dorm life and PT for when brace comes in.  She was interested in exploring appropriate splinting and community resources r/t driving etc.   OBJECTIVE:  Note: Objective measures were completed at Evaluation unless otherwise noted.  HAND DOMINANCE: Previously R handed, now Left  ADLs: Overall ADLs: Pt generally independent with LUE Transfers/ambulation related to ADLs: Mod I with AE Eating: uses fork or knife in L hand Grooming: just brushes hair; gets help to dye her hair UB Dressing: hooks her bra beforehand, can do buttons with L hand LB Dressing: struggling with shoe due to new brace - getting new 'wider' pair of shoes  Toileting: Mod Ind Bathing: Mod Ind Tub Shower transfers: Mod Ind with walk in shower Equipment: Shower seat without back, Grab bars, and Walk in shower  IADLs: Shopping: [employer] - grandmother or friends transport her  Light housekeeping: Mod Ind at dorm Meal Prep: TBA Community mobility: Mod Ind Medication management: Mod Ind at [employer] management: TBA Handwriting: TBA as pt was born R handed and had to transition to LUE s/p ICH Classes are mostly online - she speaks into phone with a google doc to complete most work  MOBILITY STATUS: Independent and Hx of falls  POSTURE COMMENTS:  rounded shoulders and forward head, R hemiplegic UE  Sitting balance: WFL seated in arm chair  ACTIVITY TOLERANCE: Activity tolerance: Good - walks around campus to get to/from classes  FUNCTIONAL OUTCOME MEASURES: TBA: PSFS  UPPER EXTREMITY ROM:    Active ROM Right eval Left eval  Shoulder flexion <90* WNL  Shoulder abduction    Shoulder adduction    Shoulder extension    Shoulder internal rotation    Shoulder external  rotation    Elbow flexion Limited   Elbow extension Not full   Wrist flexion    Wrist extension Poor - barely neutral PROM   Wrist ulnar deviation Poor   Wrist radial deviation Poor   Wrist pronation WFL   Wrist supination Poor   (Blank rows = not tested)  UPPER EXTREMITY MMT:     RUE - 2-/5 LUE - 4-5/5  HAND FUNCTION: Grip strength: Right: unable to perform with dynamometer lbs; Left: TBA lbs  COORDINATION: Pt unable to pick up object with R hand unless fully placed in her hand and poor ability to release object  SENSATION: Light touch: Impaired  and unable to identify when OTR touch any of her fingers although she said she can tell when she is touching herself.   EDEMA: NA  MUSCLE TONE: RUE: Moderate  COGNITION: Overall cognitive status: Within functional limits for tasks assessed  VISION: Subjective report: has glasses - wears them to read or do school work - just finsihed 1 summer class and starting and new one Baseline vision: Wears glasses for reading only Visual history: NA  VISION ASSESSMENT: Not tested  Patient has difficulty with following activities due to  following visual impairments: NA  OBSERVATIONS: Pt ambulates with no AD but use of RLE C-brace. No loss of balance. The pt is well kept and has RLE TKFO brace applied donned.                                                                                                                           TODAY'S TREATMENT:    Self Care:  OT reviewed doff and don of R medium neoprene thumb spica brace to improve positioning of R hand for attempted grasp assumption and support of wrist throughout the day. Pt independent with wear though required increased time to don.   - Therapeutic exercises completed for duration as noted below including:  - Supine Shoulder Flexion Extension AAROM with Dowel  - 1 x daily - 10 reps - 5-10 second hold - Supine Shoulder Press with Dowel  - 1 x daily - 10 reps - 5-10 second  hold - Supine Bicep Curl with Cane  - 1 x daily - 10 reps - 5-10 second hold - Supine Shoulder Horizontal Abduction Adduction AAROM with Dowel (Mirrored)  - 1 x daily - 10 reps - 5-10 second hold - Standing Wrist Flexion Extension AROM with Dowel  - 1 x daily - 10 reps - 5-10 second hold  PATIENT EDUCATION: Education details: L bracing options; dowel HEP Person educated: Patient and grandmother Education method: Explanation, Verbal cues, and Handouts Education comprehension: verbalized understanding, verbal cues required, and needs further education  HOME EXERCISE PROGRAM: 09/30/2023: Dowel HEP Access Code: [email or web address]  GOALS: Goals reviewed with patient? Yes  SHORT TERM GOALS: Target date: 10/24/23  Patient will demonstrate initial R UE PROM and LUE coordination HEP with 25% verbal cues or less for proper execution. Baseline: New to outpt OT Goal status: IN PROGRESS   2.  Pt will be independent with splint wear and care for RUE daytime/nighttime with appropriate options available Baseline: custom hand splint at home - increased use lately but not consistent previously Goal status: IN PROGRESS    3.  Pt will recall the 5 main sensory precautions (cold, heat, sharp/breakable, chemical, and heavy) as needed to prevent injury/harm secondary to impairments.  Baseline: New to outpt OT  Goal status: INITIAL   4. Pt will verbalize understanding of adapted strategies and/or equipment PRN to increase safety and independence with ADLs and IADLs.             Baseline: New to outpt OT             Goal Status: IN PROGRESS   LONG TERM GOALS: Target date: 11/28/23   Patient will demonstrate updated BUE HEP with visual handouts only for proper execution - RUE stretching and LUE coordination. Baseline: New to outpt OT  Goal status: INITIAL   2.  Pt will independently recall at least 3 ways to manage spasticity in RUE as needed to prevent pain and maximize overall function.   Baseline: New to outpt  OT  Goal Status: IN PROGRESS  3. Pt will verbalize understanding of community resources to assist with future planning for independent living - housing, work, AE, transportation etc.. Baseline: New to outpt OT s/p 2 years Goal status: INITIAL   4. Pt will be aware/use sensory stimulation activities to promote max sensory awareness of R hand for engaging hand in bimanual tasks.  Baseline: unable to identify 5/5 RUE fingers touched with vision occluded Goal status: INITIAL  ASSESSMENT:  CLINICAL IMPRESSION: Patient demonstrates good understanding of HEP this date though may require additional review depending on hand support type. Will continue to progress towards goals as written.  PERFORMANCE DEFICITS: in functional skills including ADLs, IADLs, coordination, dexterity, sensation, tone, ROM, strength, fascial restrictions, muscle spasms, flexibility, Fine motor control, Gross motor control, mobility, balance, endurance, decreased knowledge of precautions, decreased knowledge of use of DME, wound, skin integrity, and UE functional use, cognitive skills including problem solving, and psychosocial skills including coping strategies, environmental adaptation, and routines and behaviors.   IMPAIRMENTS: are limiting patient from ADLs, IADLs, rest and sleep, education, work, leisure, and social participation.   CO-MORBIDITIES: may have co-morbidities  that affects occupational performance. Patient will benefit from skilled OT to address above impairments and improve overall function.  REHAB POTENTIAL: Good  PLAN:  OT FREQUENCY: 1-2x/week  OT DURATION: 8 weeks  PLANNED INTERVENTIONS: 97168 OT Re-evaluation, 97535 self care/ADL training, 02889 therapeutic exercise, 97530 therapeutic activity, 97112 neuromuscular re-education, 97140 manual therapy, 97035 ultrasound, [id number] electrical stimulation (manual), 97760 Orthotic Initial, [id number] Orthotic/Prosthetic subsequent, passive range of motion,  balance training, functional mobility training, psychosocial skills training, energy conservation, coping strategies training, patient/family education, and DME and/or AE instructions  RECOMMENDED OTHER SERVICES: NA - PT eval completed on same date  CONSULTED AND AGREED WITH PLAN OF CARE: Patient and family member/caregiver  PLAN FOR NEXT SESSIONS: review Dowel HEP with use of Active Hand?  PSFS if pt can come up with a couple of ideas + add goal Check splints - Scott checking on RMI NMES and/or neuro glove options - review PRN Putty HEP for LUE with RUE stabilizing  Community resources (provided [employer] and Driving Eval info at eval) R UE thumb position to promote grasp/stabilization with R hand AE considerations   Jocelyn CHRISTELLA Bottom, OT 09/30/2023, 4:21 PM

## 2023-09-30 NOTE — Telephone Encounter (Signed)
 Hello,   Based on the pt's presentation during OT sessions, they would benefit from a right resting hand splint. Recommending a prefabricated splint given ability to wash outside cover, ability to adjust as needed, and for pt comfort.  If approved, please send order to [facility] attn: Glendia Ponto for this pt. Their fax number is [phone].   Thank you!   Jocelyn Bottom, OTR/L 09/30/2023 [facility]ational Therapy [facility] Phone: [phone] Fax: [phone]

## 2023-10-02 NOTE — Telephone Encounter (Signed)
 Sent order.  Thanks

## 2023-10-03 ENCOUNTER — Ambulatory Visit: Attending: Physician Assistant | Admitting: Physical Therapy

## 2023-10-03 ENCOUNTER — Ambulatory Visit: Admitting: Occupational Therapy

## 2023-10-03 DIAGNOSIS — M6281 Muscle weakness (generalized): Secondary | ICD-10-CM

## 2023-10-03 DIAGNOSIS — M25631 Stiffness of right wrist, not elsewhere classified: Secondary | ICD-10-CM | POA: Diagnosis present

## 2023-10-03 DIAGNOSIS — Z7409 Other reduced mobility: Secondary | ICD-10-CM | POA: Insufficient documentation

## 2023-10-03 DIAGNOSIS — R29818 Other symptoms and signs involving the nervous system: Secondary | ICD-10-CM | POA: Diagnosis present

## 2023-10-03 DIAGNOSIS — M25611 Stiffness of right shoulder, not elsewhere classified: Secondary | ICD-10-CM

## 2023-10-03 DIAGNOSIS — R208 Other disturbances of skin sensation: Secondary | ICD-10-CM

## 2023-10-03 DIAGNOSIS — R278 Other lack of coordination: Secondary | ICD-10-CM

## 2023-10-03 DIAGNOSIS — R2681 Unsteadiness on feet: Secondary | ICD-10-CM | POA: Diagnosis present

## 2023-10-03 DIAGNOSIS — I69151 Hemiplegia and hemiparesis following nontraumatic intracerebral hemorrhage affecting right dominant side: Secondary | ICD-10-CM | POA: Diagnosis present

## 2023-10-03 NOTE — Patient Instructions (Addendum)
  Safety considerations for loss of sensation:   Look at affected hand when using it!   Do NOT use affected arm for anything: sharp, hot, breakable, or too heavy  Always check temperature of water (for showering, washing dishes, etc) with UNaffected arm/extremity  Consider travel mugs w/ lids to transport hot liquids/coffee  Consider alternative options and/or adaptive equipment to make things safer (ex: hand chopper or cut resistant glove for chopping vegetables)   Avoid cold temperatures as well (wear glove in cold temperatures, get ice w/ unaffected extremity)  AVOID handling chemicals and machinery    Ways to Manage Spasticity Heat  Deep pressure  Stretching  Use of a splint at night  Weight-bearing (leaning into your arm/hand with your own body weight)  NMES unit  Botox  (injections as prescribed by a physician)  Muscle Relaxers (medications as prescribed by a physician)

## 2023-10-03 NOTE — Therapy (Signed)
 OUTPATIENT PHYSICAL THERAPY NEURO TREATMENT   Patient Name: Monic Virginia  Kamps MRN: [id number] DOB:[birth year], 23 y.o., female Today's Date: 10/03/2023   PCP: Alm Bilis, MD REFERRING PROVIDER: Charolet Yvonna SQUIBB, PA-C  END OF SESSION:  PT End of Session - 10/03/23 1231     Visit Number 5    Number of Visits 13    Date for PT Re-Evaluation 11/14/23    Authorization Type Aetna Medicare/ medicaid    PT Start Time 1233    PT Stop Time 1315    PT Time Calculation (min) 42 min    Equipment Utilized During Treatment Gait belt;Other (comment)   c-brace   Activity Tolerance Patient tolerated treatment well    Behavior During Therapy [facility] for tasks assessed/performed          Past Medical History:  Diagnosis Date   Asthma    Stroke [facility])    Past Surgical History:  Procedure Laterality Date   BRAIN SURGERY  02/20/2016   Patient Active Problem List   Diagnosis Date Noted   Mid back pain on left side 07/17/2022   Sleep difficulties 07/17/2022   MDD (major depressive disorder), recurrent severe, without psychosis (HCC) 08/04/2019   Suicide ideation 08/04/2019   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 (HCC) 02/26/2018   Adjustment disorder 02/26/2018   Spasticity 04/15/2017   Impaired functional mobility, balance, gait, and endurance 07/08/2016   AVM (arteriovenous malformation) brain 05/27/2016   Expressive aphasia 03/26/2016   Hydrocephalus, acquired (HCC) 03/07/2016   Neurocognitive deficits 03/07/2016   Intraparenchymal hematoma of brain (HCC) 02/20/2016   Hypertension 02/20/2016   Bradycardia 02/20/2016   Midline shift of brain due to hematoma (HCC) 02/20/2016    ONSET DATE: 08/28/23  REFERRING DIAG: P30.848 (ICD-10-CM) - Hemiplegia and hemiparesis following nontraumatic intracerebral hemorrhage affecting right dominant side   THERAPY DIAG:  Hemiplegia and hemiparesis following nontraumatic intracerebral  hemorrhage affecting right dominant side (HCC)  Stiffness of right wrist, not elsewhere classified  Muscle weakness (generalized)  Other lack of coordination  Unsteadiness on feet  Other symptoms and signs involving the nervous system  Other disturbances of skin sensation  Rationale for Evaluation and Treatment: Rehabilitation  SUBJECTIVE:                                                                                                                                                                                             SUBJECTIVE STATEMENT: Patient arrives to clinic in C- brace.  Denies falls.  The brace was causing her pain yesterday [facility] - mostly the arch of her  foot. Pt accompanied by: self  PERTINENT HISTORY: asthma, CVA  PAIN:  Are you having pain? No  PRECAUTIONS: Fall  PATIENT GOALS: to learn how to walk in her new brace, stairs, ramps, floor transfers, stumble recovery                                                                                                                              TREATMENT NMR: -4-8 hurdles CGA leading with RLE, pt does better with increased distance from hurdle when attempting to flex knee over higher hurdles 6 Blaze pods on random one color taps setting for improved SLS, pivot turns, incline management w/ C-brace.  Performed on 1 minute intervals with 30 second rest periods.  Pt requires SBA-CGA guarding. Round 1:  ramp and platform setup.  8 hits. Round 2:   setup.  10 hits. Round 3:   setup.  9 hits. Notable errors/deficits:  Poor eccentric right quad control on decline. 6 Blaze pods on random one color taps setting for improved SLS, hip flexion and adduction w/ C-brace.  Performed on 1 minute intervals with 30 second rest periods.  Pt requires SBA-CGA guarding. Round 1:  3x6 stair lateral pod setup using ipsilateral LE taps.  16 hits. Round 2:   setup.  16 hits. Round 3:  Changed to crossbody LE  taps w/ same 3 stair setup.  12 hits. Notable errors/deficits:  Increased time to perform crossbody taps, some intermittent step up to reach top pods w/ RLE.  Intermittent LUE support for balance. -tall kneel balloon taps > R half kneel balloon taps > L half kneel balloon taps  PATIENT EDUCATION: Education details: See above, continue HEP Person educated: Patient and Parent Education method: Explanation and Demonstration Education comprehension: verbalized understanding and needs further education  HOME EXERCISE PROGRAM: Access Code: [email or web address] URL: [email or web address] Date: 09/22/2023 Prepared by: Delon Pop  Exercises - Seated March With Resistance at Feet  - 1 x daily - 7 x weekly - 3 sets - 10 reps  -donning/doffing c-brace in sitting vs long sitting on bed   GOALS: Goals reviewed with patient? Yes  SHORT TERM GOALS: Target date: 10/17/23  Pt will be independent with initial HEP for improved proximal strength and balance  Baseline: to be provided  Goal status: INITIAL  2.  Pt will improve 5x STS to </= 20 sec to demo improved functional LE strength and balance   Baseline: 26s + L UE Goal status: INITIAL  3.  Pt will improve gait speed to >/= .8/s to demonstrate improved community ambulation  Baseline: .64m/s Goal status: INITIAL  4.  Patient will negotiate at least 12 steps with reciprocal pattern, HR use as necessary and no more than CGA Baseline: CGA 8 steps Goal status: INITIAL    LONG TERM GOALS: Target date: 11/14/23  Pt will be independent with final HEP for improved proximal strength and gait mechanics  Baseline: to be provided Goal status: INITIAL  2.  Pt will improve 5x STS to </= 14 sec to demo improved functional LE strength and balance   Baseline: 26s with L UE Goal status: INITIAL  3.  Pt will improve gait speed to >/= .37m/s to demonstrate improved community ambulation  Baseline: .74m/s Goal status: INITIAL  4.  Patient  will negotiate at least 12 steps, reciprocal pattern and HR use as necessary ModI  Baseline: CGA 8 steps Goal status: INITIAL  5.  Patient will both ascend and descend a ramp with LRAD, ModI and equal step lengths B  Baseline: SBA + shortened L step length  Goal status: INITIAL  6.  Patient will ambulate >/=1582ft during to demonstrate improved community ambulation endurance Baseline: 1145ft with no AD + CGA and C-brace Goal status: REVISED  7. Patient will be able to complete floor > stand transfer with no more than CGA and no UE support to simulate a fall in the community and recovery   Baseline: MinA + L UE support on mat  Goal status: REVISED  ASSESSMENT:  CLINICAL IMPRESSION: Emphasis of skilled PT session on continuing to work with c-brace to improve hip and knee flexion with obstacle management.  She manages ramp and stairs w/ SBA-CGA with difficulty with ramp descent due to eccentric control.  8 hurdles were very challenging requiring decreased proximity to obstacle in order to obtain needed RLE engagement.  She continues to benefit from skilled PT to address imbalance and gait mechanics.  Continue POC.    OBJECTIVE IMPAIRMENTS: Abnormal gait, decreased balance, decreased endurance, decreased knowledge of use of DME, difficulty walking, decreased strength, impaired UE functional use, and pain.   ACTIVITY LIMITATIONS: carrying, bending, squatting, stairs, locomotion level, and caring for others  PARTICIPATION LIMITATIONS: meal prep, cleaning, interpersonal relationship, driving, shopping, community activity, occupation, and school  PERSONAL FACTORS: Age, Past/current experiences, Profession, Social background, Time since onset of injury/illness/exacerbation, and Transportation are also affecting patient's functional outcome.   REHAB POTENTIAL: Fair time since onset  CLINICAL DECISION MAKING: Stable/uncomplicated  EVALUATION COMPLEXITY: Low  PLAN:  PT FREQUENCY:  1-2x/week  PT DURATION: 8 weeks  PLANNED INTERVENTIONS: 97164- PT Re-evaluation, 97750- Physical Performance Testing, 97110-Therapeutic exercises, 97530- Therapeutic activity, [id number]- Neuromuscular re-education, 97535- Self Care, 02859- Manual therapy, [id number]- Gait training, [id number]- Orthotic Initial, [phone]- Orthotic/Prosthetic subsequent, [phone]- Aquatic Therapy, [phone]- Electrical stimulation (manual), Patient/Family education, Balance training, Stair training, Vestibular training, Visual/preceptual remediation/compensation, and DME instructions  PLAN FOR NEXT SESSION: tall kneel-> half kneel transition, balance in those positions, blaze pods? Ultimately working toward floor transfer with no UE support, HEP, proximal LE strengthening, ramp, stairs   Daved KATHEE Bull, PT, DPT  10/03/2023, 2:20 PM

## 2023-10-03 NOTE — Therapy (Signed)
 OUTPATIENT OCCUPATIONAL THERAPY NEURO TREATMENT  Patient Name: Savannah Virginia  [name] MRN: [id number] DOB:18-Sep-2000, 23 y.o., female Today's Date: 10/03/2023  PCP: Savannah Lenis, MD REFERRING PROVIDER: Waddell [name] HERO, MD  END OF SESSION:  OT End of Session - 10/03/23 1150     Visit Number 4    Number of Visits 13    Date for OT Re-Evaluation 11/14/23    Authorization Type [employer] [id number]/ [employer] covered @ 100%    OT Start Time 1149    OT Stop Time 1228    OT Time Calculation (min) 39 min    Activity Tolerance Patient tolerated treatment well    Behavior During Therapy WFL for tasks assessed/performed          Past Medical History:  Diagnosis Date   Asthma    Stroke [facility])    Past Surgical History:  Procedure Laterality Date   BRAIN SURGERY  02/20/2016   Patient Active Problem List   Diagnosis Date Noted   Mid back pain on left side 07/17/2022   Sleep difficulties 07/17/2022   MDD (major depressive disorder), recurrent severe, without psychosis (HCC) 08/04/2019   Suicide ideation 08/04/2019   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 (HCC) 02/26/2018   Adjustment disorder 02/26/2018   Spasticity 04/15/2017   Impaired functional mobility, balance, gait, and endurance 07/08/2016   AVM (arteriovenous malformation) brain 05/27/2016   Expressive aphasia 03/26/2016   Hydrocephalus, acquired (HCC) 03/07/2016   Neurocognitive deficits 03/07/2016   Intraparenchymal hematoma of brain (HCC) 02/20/2016   Hypertension 02/20/2016   Bradycardia 02/20/2016   Midline shift of brain due to hematoma (HCC) 02/20/2016    ONSET DATE: 04/07/2023  REFERRING DIAG: P30.848 (ICD-10-CM) - 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  THERAPY DIAG:  Hemiplegia and hemiparesis following nontraumatic intracerebral hemorrhage affecting right dominant side (HCC)  Stiffness of right wrist,  not elsewhere classified  Impaired functional mobility, balance, gait, and endurance  Muscle weakness (generalized)  Other lack of coordination  Other symptoms and signs involving the nervous system  Other disturbances of skin sensation  Stiffness of right shoulder, not elsewhere classified  Rationale for Evaluation and Treatment: Rehabilitation  SUBJECTIVE:   SUBJECTIVE STATEMENT: Pt reports she has forgotten to wear her neoprene brace when outside the house and has noticed this has led to an increase to her thumb pain. She continues to wear during the day at home with improvement to R thumb pain.   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 Trying to get better with bracing overnight. Still not doing as often as she would like, not really doing it all. Better when she was on break from school. Hard when she gets back to her dorm late at night, class goes until 9 pm.   PRECAUTIONS: Fall  WEIGHT BEARING RESTRICTIONS: No  PAIN:  Are you having pain? Not really but she can't always feel where the hand is, she gets weird feelings/aches throughout the arm   FALLS: Has patient fallen in last 6 months? Yes. Number of falls 1 when she was in a boot on L leg for a sprain x 2 months with  AFO on R foot.  LIVING ENVIRONMENT: Lives with: lives with their family and grandma but will be moving back into the dorms at [employer] in a month - has handicap accessible bathroom Lives in: House/apartment Stairs: Yes: External: 7 steps; bilateral Has following equipment at home: shower chair, Grab bars, and walk in showers  Pt has 2 years  remaining in her degree ie) studying studio art - concentration in painting  PLOF: Independent with basic ADLs  PATIENT GOALS: Pt able to report PT goals but none specific to OT although MR review reveals she was interested in OT to work on skills related to dorm life and PT for when brace comes in.  She was interested in exploring appropriate splinting and community resources r/t driving etc.   OBJECTIVE:  Note: Objective measures were completed at Evaluation unless otherwise noted.  HAND DOMINANCE: Previously R handed, now Left  ADLs: Overall ADLs: Pt generally independent with LUE Transfers/ambulation related to ADLs: Mod I with AE Eating: uses fork or knife in L hand Grooming: just brushes hair; gets help to dye her hair UB Dressing: hooks her bra beforehand, can do buttons with L hand LB Dressing: struggling with shoe due to new brace - getting new 'wider' pair of shoes  Toileting: Mod Ind Bathing: Mod Ind Tub Shower transfers: Mod Ind with walk in shower Equipment: Shower seat without back, Grab bars, and Walk in shower  IADLs: Shopping: [employer] - grandmother or friends transport her  Light housekeeping: Mod Ind at dorm Meal Prep: TBA Community mobility: Mod Ind Medication management: Mod Ind at [employer] management: TBA Handwriting: TBA as pt was born R handed and had to transition to LUE s/p ICH Classes are mostly online - she speaks into phone with a google doc to complete most work  MOBILITY STATUS: Independent and Hx of falls  POSTURE COMMENTS:  rounded shoulders and forward head, R hemiplegic UE  Sitting balance: WFL seated in arm chair  ACTIVITY TOLERANCE: Activity tolerance: Good - walks around campus to get to/from classes  FUNCTIONAL OUTCOME MEASURES: TBA: PSFS  UPPER EXTREMITY ROM:    Active ROM Right eval Left eval  Shoulder flexion <90* WNL  Shoulder abduction    Shoulder adduction    Shoulder extension    Shoulder internal rotation     Shoulder external rotation    Elbow flexion Limited   Elbow extension Not full   Wrist flexion    Wrist extension Poor - barely neutral PROM   Wrist ulnar deviation Poor   Wrist radial deviation Poor   Wrist pronation WFL   Wrist supination Poor   (Blank rows = not tested)  UPPER EXTREMITY MMT:     RUE - 2-/5 LUE - 4-5/5  HAND FUNCTION: Grip strength: Right: unable to perform with dynamometer lbs; Left: TBA lbs  COORDINATION: Pt unable to pick up object with R hand unless fully placed in her hand and poor ability to release object  SENSATION: Light touch: Impaired  and unable to identify when OTR touch any of her fingers although she said she can tell when she is touching herself.   EDEMA: NA  MUSCLE TONE: RUE: Moderate  COGNITION: Overall cognitive status: Within functional limits for tasks assessed  VISION: Subjective report: has glasses - wears them to read or do school work - just finished 1 summer class and starting and new one Baseline vision: Wears glasses for reading only Visual history: NA  VISION ASSESSMENT: Not tested  Patient  has difficulty with following activities due to following visual impairments: NA  OBSERVATIONS: Pt ambulates with no AD but use of RLE C-brace. No loss of balance. The pt is well kept and has RLE TKFO brace applied donned.                                                                                                                           TODAY'S TREATMENT:    Self Care:  Therapist reviewed goals with patient and updated patient progression.  No additional functional limitations identified. Issued handout for spasticity management for future review as needed.  OT educated patient in Safety considerations for loss of sensation as noted in patient instructions to reduce risk of injury to affected hand.  Pt encouraged to be careful of sharp, hot/cold (check temperatures of water), breakable, heavy objects and chemicals. Patient  verbalized understanding. Handout provided.   - Therapeutic activities completed for duration as noted below including: Pt completed arm bike at table for 8 minutes with use of R active hand brace and padding over pedal for endurance, ROM, and strengthening of affected extremity using reciprocal arm movements. Pt alternating direction of pedaling halfway through. Intermittent cues provided to maintain stability with respect to controlled RUE movement. PATIENT EDUCATION: Education details: goal progression; sensory precautions; spasticity management Person educated: Patient and grandmother Education method: Explanation, Verbal cues, and Handouts Education comprehension: verbalized understanding, verbal cues required, and needs further education  HOME EXERCISE PROGRAM: 09/30/2023: [place] HEP Access Code: [email or web address] 10/03/2023: sensory precautions; spasticity management  GOALS: Goals reviewed with patient? Yes  SHORT TERM GOALS: Target date: 10/24/23  Patient will demonstrate initial R UE PROM and LUE coordination HEP with 25% verbal cues or less for proper execution. Baseline: New to outpt OT Goal status: IN PROGRESS   2.  Pt will be independent with splint wear and care for RUE daytime/nighttime with appropriate options available Baseline: custom hand splint at home - increased use lately but not consistent previously Goal status: IN PROGRESS    3.  Pt will recall the 5 main sensory precautions (cold, heat, sharp/breakable, chemical, and heavy) as needed to prevent injury/harm secondary to impairments.  Baseline: New to outpt OT  Goal status: IN PROGRESS   4. Pt will verbalize understanding of adapted strategies and/or equipment PRN to increase safety and independence with ADLs and IADLs.             Baseline: New to outpt OT             Goal Status: IN PROGRESS   LONG TERM GOALS: Target date: 11/28/23   Patient will demonstrate updated BUE HEP with visual handouts only for proper  execution - RUE stretching and LUE coordination. Baseline: New to outpt OT  Goal status: INITIAL   2.  Pt will independently recall at least 3 ways to manage spasticity in RUE as needed to prevent pain and maximize overall function.   Baseline: New to outpt OT  Goal Status: MET  3. Pt will verbalize understanding of community resources to assist with future planning for independent living - housing, work, AE, transportation etc.. Baseline: New to outpt OT s/p 2 years Goal status: INITIAL   4. Pt will be aware/use sensory stimulation activities to promote max sensory awareness of R hand for engaging hand in bimanual tasks.  Baseline: unable to identify 5/5 RUE fingers touched with vision occluded Goal status: INITIAL  ASSESSMENT:  CLINICAL IMPRESSION: Patient demonstrates good tolerance to therapy this session and good understanding of safety/symptom management recommendations. Will continue to progress towards goals.   PERFORMANCE DEFICITS: in functional skills including ADLs, IADLs, coordination, dexterity, sensation, tone, ROM, strength, fascial restrictions, muscle spasms, flexibility, Fine motor control, Gross motor control, mobility, balance, endurance, decreased knowledge of precautions, decreased knowledge of use of DME, wound, skin integrity, and UE functional use, cognitive skills including problem solving, and psychosocial skills including coping strategies, environmental adaptation, and routines and behaviors.   IMPAIRMENTS: are limiting patient from ADLs, IADLs, rest and sleep, education, work, leisure, and social participation.   CO-MORBIDITIES: may have co-morbidities  that affects occupational performance. Patient will benefit from skilled OT to address above impairments and improve overall function.  REHAB POTENTIAL: Good  PLAN:  OT FREQUENCY: 1-2x/week  OT DURATION: 8 weeks  PLANNED INTERVENTIONS: 97168 OT Re-evaluation, 97535 self care/ADL training, 02889  therapeutic exercise, 97530 therapeutic activity, 97112 neuromuscular re-education, 97140 manual therapy, 97035 ultrasound, [id number] electrical stimulation (manual), 97760 Orthotic Initial, [id number] Orthotic/Prosthetic subsequent, passive range of motion, balance training, functional mobility training, psychosocial skills training, energy conservation, coping strategies training, patient/family education, and DME and/or AE instructions  RECOMMENDED OTHER SERVICES: NA - PT eval completed on same date  CONSULTED AND AGREED WITH PLAN OF CARE: Patient and family member/caregiver  PLAN FOR NEXT SESSIONS: review sensory precautions and update goal  Check splints - Scott to bring RMI week of 8/4 NMES and/or neuro glove options - review PRN Putty HEP for LUE with RUE stabilizing  Has pt followed up with? Community resources (provided [employer] info at eval) R UE thumb position to promote grasp/stabilization with R hand AE considerations   Jocelyn CHRISTELLA Bottom, OT 10/03/2023, 11:51 AM

## 2023-10-06 ENCOUNTER — Ambulatory Visit

## 2023-10-06 ENCOUNTER — Ambulatory Visit: Admitting: Occupational Therapy

## 2023-10-06 DIAGNOSIS — I69151 Hemiplegia and hemiparesis following nontraumatic intracerebral hemorrhage affecting right dominant side: Secondary | ICD-10-CM | POA: Diagnosis not present

## 2023-10-06 DIAGNOSIS — M6281 Muscle weakness (generalized): Secondary | ICD-10-CM

## 2023-10-06 DIAGNOSIS — R208 Other disturbances of skin sensation: Secondary | ICD-10-CM

## 2023-10-06 DIAGNOSIS — R278 Other lack of coordination: Secondary | ICD-10-CM

## 2023-10-06 DIAGNOSIS — R29818 Other symptoms and signs involving the nervous system: Secondary | ICD-10-CM

## 2023-10-06 DIAGNOSIS — M25631 Stiffness of right wrist, not elsewhere classified: Secondary | ICD-10-CM

## 2023-10-06 DIAGNOSIS — R2681 Unsteadiness on feet: Secondary | ICD-10-CM

## 2023-10-06 NOTE — Therapy (Signed)
 OUTPATIENT PHYSICAL THERAPY NEURO TREATMENT   Patient Name: Savannah Virginia  [name] MRN: [id number] DOB:Apr 20, 2000, 23 y.o., female Today's Date: 10/06/2023   PCP: Alm Bilis, MD REFERRING PROVIDER: Charolet Yvonna SQUIBB, PA-C  END OF SESSION:  PT End of Session - 10/06/23 1018     Visit Number 6    Number of Visits 13    Date for PT Re-Evaluation 11/14/23    Authorization Type Aetna Medicare/ medicaid    PT Start Time 1016    PT Stop Time 1057    PT Time Calculation (min) 41 min    Equipment Utilized During Treatment Gait belt;Other (comment)   c-brace   Activity Tolerance Patient tolerated treatment well    Behavior During Therapy [facility] for tasks assessed/performed          Past Medical History:  Diagnosis Date   Asthma    Stroke [facility])    Past Surgical History:  Procedure Laterality Date   BRAIN SURGERY  02/20/2016   Patient Active Problem List   Diagnosis Date Noted   Mid back pain on left side 07/17/2022   Sleep difficulties 07/17/2022   MDD (major depressive disorder), recurrent severe, without psychosis (HCC) 08/04/2019   Suicide ideation 08/04/2019   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 (HCC) 02/26/2018   Adjustment disorder 02/26/2018   Spasticity 04/15/2017   Impaired functional mobility, balance, gait, and endurance 07/08/2016   AVM (arteriovenous malformation) brain 05/27/2016   Expressive aphasia 03/26/2016   Hydrocephalus, acquired (HCC) 03/07/2016   Neurocognitive deficits 03/07/2016   Intraparenchymal hematoma of brain (HCC) 02/20/2016   Hypertension 02/20/2016   Bradycardia 02/20/2016   Midline shift of brain due to hematoma (HCC) 02/20/2016    ONSET DATE: 08/28/23  REFERRING DIAG: P30.848 (ICD-10-CM) - Hemiplegia and hemiparesis following nontraumatic intracerebral hemorrhage affecting right dominant side   THERAPY DIAG:  Muscle weakness (generalized)  Other lack of  coordination  Unsteadiness on feet  Rationale for Evaluation and Treatment: Rehabilitation  SUBJECTIVE:                                                                                                                                                                                             SUBJECTIVE STATEMENT: Patient reports doing well. Started moving her stuff into the dorms. Wore the brace for a longer period of time. If she wore her brace for hours the day before, when she puts it on the next day, her arch will start to be sore within the first hour. If she hasn't worn the brace in a while, the arch starts hurting by the 2nd/3rd  hour. Denies falls.  Pt accompanied by: self  PERTINENT HISTORY: asthma, CVA  PAIN:  Are you having pain? No  PRECAUTIONS: Fall  PATIENT GOALS: to learn how to walk in her new brace, stairs, ramps, floor transfers, stumble recovery                                                                                                                              TREATMENT Theract: -extensive discussion with patient and grandmother re: origin of arch pain and possible benefit from stretch strap to use outside the brace to assist with plantar mobility   -patient and grandmother agreeable to this -removed R heel wedge to assist with minimizing arch pain as well with some relief noted   -dicussed that patient can replace wedge at any time should she want to   -patient verbalized understanding  NMR: -alternating step up to 8 step with L UE support on // bars   -initially L UE with R knee drive   -patient extending R hip prior to flexing knee to drive possibly to trigger brace, possibly out of habit   -R LE for improved power   PATIENT EDUCATION: Education details: See above, continue HEP Person educated: Patient and Parent Education method: Explanation and Demonstration Education comprehension: verbalized understanding and needs further education  HOME  EXERCISE PROGRAM: Access Code: [email or web address] URL: [email or web address] Date: 09/22/2023 Prepared by: Delon Pop  Exercises - Seated March With Resistance at Feet  - 1 x daily - 7 x weekly - 3 sets - 10 reps  -donning/doffing c-brace in sitting vs long sitting on bed   GOALS: Goals reviewed with patient? Yes  SHORT TERM GOALS: Target date: 10/17/23  Pt will be independent with initial HEP for improved proximal strength and balance  Baseline: to be provided  Goal status: INITIAL  2.  Pt will improve 5x STS to </= 20 sec to demo improved functional LE strength and balance   Baseline: 26s + L UE Goal status: INITIAL  3.  Pt will improve gait speed to >/= .8/s to demonstrate improved community ambulation  Baseline: .49m/s Goal status: INITIAL  4.  Patient will negotiate at least 12 steps with reciprocal pattern, HR use as necessary and no more than CGA Baseline: CGA 8 steps Goal status: INITIAL    LONG TERM GOALS: Target date: 11/14/23  Pt will be independent with final HEP for improved proximal strength and gait mechanics  Baseline: to be provided Goal status: INITIAL  2.  Pt will improve 5x STS to </= 14 sec to demo improved functional LE strength and balance   Baseline: 26s with L UE Goal status: INITIAL  3.  Pt will improve gait speed to >/= .106m/s to demonstrate improved community ambulation  Baseline: .30m/s Goal status: INITIAL  4.  Patient will negotiate at least 12 steps, reciprocal pattern and HR use as necessary ModI  Baseline: CGA 8 steps Goal status: INITIAL  5.  Patient will both ascend and descend a ramp with LRAD, ModI and equal step lengths B  Baseline: SBA + shortened L step length  Goal status: INITIAL  6.  Patient will ambulate >/=1561ft during to demonstrate improved community ambulation endurance Baseline: 1150ft with no AD + CGA and C-brace Goal status: REVISED  7. Patient will be able to complete floor > stand  transfer with no more than CGA and no UE support to simulate a fall in the community and recovery   Baseline: MinA + L UE support on mat  Goal status: REVISED  ASSESSMENT:  CLINICAL IMPRESSION: Patient seen for skilled PT session with emphasis on patient education and R LE NMR in setting of stair practice. No onset of R arch pain with wedge removed this date. She also has an arch wedge in her AFO that, should it need to be modified, would need to be done by orthotist. Improving R LE power noted with step up, but does require UE use for balance. Likely out of habit, patient extending R hip prior to knee flexion to allow for increased foot clearance on step. Continue POC.    OBJECTIVE IMPAIRMENTS: Abnormal gait, decreased balance, decreased endurance, decreased knowledge of use of DME, difficulty walking, decreased strength, impaired UE functional use, and pain.   ACTIVITY LIMITATIONS: carrying, bending, squatting, stairs, locomotion level, and caring for others  PARTICIPATION LIMITATIONS: meal prep, cleaning, interpersonal relationship, driving, shopping, community activity, occupation, and school  PERSONAL FACTORS: Age, Past/current experiences, Profession, Social background, Time since onset of injury/illness/exacerbation, and Transportation are also affecting patient's functional outcome.   REHAB POTENTIAL: Fair time since onset  CLINICAL DECISION MAKING: Stable/uncomplicated  EVALUATION COMPLEXITY: Low  PLAN:  PT FREQUENCY: 1-2x/week  PT DURATION: 8 weeks  PLANNED INTERVENTIONS: 97164- PT Re-evaluation, 97750- Physical Performance Testing, 97110-Therapeutic exercises, 97530- Therapeutic activity, [id number]- Neuromuscular re-education, 97535- Self Care, 02859- Manual therapy, [id number]- Gait training, [phone]- Orthotic Initial, [phone]- Orthotic/Prosthetic subsequent, [phone]- Aquatic Therapy, [phone]- Electrical stimulation (manual), Patient/Family education, Balance training, Stair training,  Vestibular training, Visual/preceptual remediation/compensation, and DME instructions  PLAN FOR NEXT SESSION: tall kneel-> half kneel transition, balance in those positions, blaze pods? Ultimately working toward floor transfer with no UE support, HEP, proximal LE strengthening, ramp, stairs   Delon DELENA Pop, PT Delon DELENA Pop, PT, DPT, CBIS  10/06/2023, 11:04 AM

## 2023-10-06 NOTE — Therapy (Unsigned)
 OUTPATIENT OCCUPATIONAL THERAPY NEURO TREATMENT  Patient Name: Savannah Virginia  [name] MRN: [id number] DOB:11/13/2000, 23 y.o., female Today's Date: 10/06/2023  PCP: Pura Lenis, MD REFERRING PROVIDER: Waddell Corean HERO, MD  END OF SESSION:  OT End of Session - 10/06/23 1112     Visit Number 5    Number of Visits 13    Date for OT Re-Evaluation 11/14/23    Authorization Type A[employer] [id number]/ [employer] covered @ 100%    OT Start Time 1110    OT Stop Time 1155    OT Time Calculation (min) 45 min    Equipment Utilized During Treatment RMI resting hand splint    Activity Tolerance Patient tolerated treatment well    Behavior During Therapy WFL for tasks assessed/performed          Past Medical History:  Diagnosis Date   Asthma    Stroke [facility])    Past Surgical History:  Procedure Laterality Date   BRAIN SURGERY  02/20/2016   Patient Active Problem List   Diagnosis Date Noted   Mid back pain on left side 07/17/2022   Sleep difficulties 07/17/2022   MDD (major depressive disorder), recurrent severe, without psychosis (HCC) 08/04/2019   Suicide ideation 08/04/2019   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 (HCC) 02/26/2018   Adjustment disorder 02/26/2018   Spasticity 04/15/2017   Impaired functional mobility, balance, gait, and endurance 07/08/2016   AVM (arteriovenous malformation) brain 05/27/2016   Expressive aphasia 03/26/2016   Hydrocephalus, acquired (HCC) 03/07/2016   Neurocognitive deficits 03/07/2016   Intraparenchymal hematoma of brain (HCC) 02/20/2016   Hypertension 02/20/2016   Bradycardia 02/20/2016   Midline shift of brain due to hematoma (HCC) 02/20/2016    ONSET DATE: 04/07/2023  REFERRING DIAG: P30.848 (ICD-10-CM) - 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  Classes start on 19th and she will move in full time on August 14th  Orthotist arrived with resting hand splint today.  Pt reports she has been trying to wear the neoprene brace to help with her R thumb pain.   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 Emeline for Botox  going well. Trying to get better with bracing overnight. Still not doing as often as she would like, not really doing it all. Better when she was on break from school. Hard when she gets back to her dorm late at night, class goes until 9 pm.   PRECAUTIONS: Fall  WEIGHT BEARING RESTRICTIONS: No  PAIN:  Are you having pain? Not really but she can't always feel where the hand is, she gets weird feelings/aches throughout the arm   FALLS: Has patient fallen in last 6 months? Yes. Number of falls 1 when she was in a boot on L leg for a sprain x 2 months with AFO on R foot.  LIVING ENVIRONMENT: Lives  with: lives with their family and grandma but will be moving back into the dorms at [employer] in a month - has handicap accessible bathroom Lives in: House/apartment Stairs: Yes: External: 7 steps; bilateral Has following equipment at home: shower chair, Grab bars, and walk in showers  Pt has 2 years remaining in her degree ie) studying studio art - concentration in painting  PLOF: Independent with  basic ADLs  PATIENT GOALS: Pt able to report PT goals but none specific to OT although MR review reveals she was interested in OT to work on skills related to dorm life and PT for when brace comes in.  She was interested in exploring appropriate splinting and community resources r/t driving etc.   OBJECTIVE:  Note: Objective measures were completed at Evaluation unless otherwise noted.  HAND DOMINANCE: Previously R handed, now Left  ADLs: Overall ADLs: Pt generally independent with LUE Transfers/ambulation related to ADLs: Mod I with AE Eating: uses fork or knife in L hand Grooming: just brushes hair; gets help to dye her hair UB Dressing: hooks her bra beforehand, can do buttons with L hand LB Dressing: struggling with shoe due to new brace - getting new 'wider' pair of shoes  Toileting: Mod Ind Bathing: Mod Ind Tub Shower transfers: Mod Ind with walk in shower Equipment: Shower seat without back, Grab bars, and Walk in shower  IADLs: Shopping: [employer] - grandmother or friends transport her  Light housekeeping: Mod Ind at dorm Meal Prep: TBA Community mobility: Mod Ind Medication management: Mod Ind at [employer] management: TBA Handwriting: TBA as pt was born R handed and had to transition to LUE s/p ICH Classes are mostly online - she speaks into phone with a google doc to complete most work  MOBILITY STATUS: Independent and Hx of falls  POSTURE COMMENTS:  rounded shoulders and forward head, R hemiplegic UE  Sitting balance: WFL seated in arm chair  ACTIVITY TOLERANCE: Activity tolerance: Good - walks around campus to get to/from classes  FUNCTIONAL OUTCOME MEASURES: TBA: PSFS  UPPER EXTREMITY ROM:    Active ROM Right eval Left eval  Shoulder flexion <90* WNL  Shoulder abduction    Shoulder adduction    Shoulder extension    Shoulder internal rotation    Shoulder external rotation    Elbow flexion Limited   Elbow extension Not full   Wrist flexion     Wrist extension Poor - barely neutral PROM   Wrist ulnar deviation Poor   Wrist radial deviation Poor   Wrist pronation WFL   Wrist supination Poor   (Blank rows = not tested)  UPPER EXTREMITY MMT:     RUE - 2-/5 LUE - 4-5/5  HAND FUNCTION: Grip strength: Right: unable to perform with dynamometer lbs; Left: TBA lbs  COORDINATION: Pt unable to pick up object with R hand unless fully placed in her hand and poor ability to release object  SENSATION: Light touch: Impaired  and unable to identify when OTR touch any of her fingers although she said she can tell when she is touching herself.   EDEMA: NA  MUSCLE TONE: RUE: Moderate  COGNITION: Overall cognitive status: Within functional limits for tasks assessed  VISION: Subjective report: has glasses - wears them to read or do school work - just finished 1 summer class and starting and new one Baseline vision: Wears glasses for reading only Visual history: NA  VISION ASSESSMENT: Not tested  Patient has difficulty with following activities due to following  visual impairments: NA  OBSERVATIONS: Pt ambulates with no AD but use of RLE C-brace. No loss of balance. The pt is well kept and has RLE TKFO brace applied donned.                                                                                                                           TODAY'S TREATMENT:    Orthotic Management:  Pt seen for orthotic fit and training with orthotist present this date for resting hand splint that isolates each individual finger and is adjustable given pt's joint changes and available ROM. Splint applied, adjusted, and pt engaged in applying splint a couple of times with use of LUE to enure proper fit, application and max comfort.  OT did apply soft padding under the splint cover to allow DIP joints to flex and minimize tendency to hyperextend.  Instructed pt on proper wearing schedules and care with good verbal understanding and return  demonstration.  Therapeutic activities completed for duration as noted below including: Pt engaged in sensory stimulation to R hand for increased awareness of tactile input to R fingers/hand and thumb.  She has decreased sensation but improvements noted with B UE involvement and with vision.  Education provided on sensory stimulation and sensory precautions. PATIENT EDUCATION: Education details: sensory precautions; spasticity management with splint application Person educated: Patient and grandmother Education method: Explanation, Verbal cues, and Handouts Education comprehension: verbalized understanding, verbal cues required, and needs further education  HOME EXERCISE PROGRAM: 09/30/2023: [place] HEP Access Code: [email or web address] 10/03/2023: sensory precautions; spasticity management  GOALS: Goals reviewed with patient? Yes  SHORT TERM GOALS: Target date: 10/24/23  Patient will demonstrate initial R UE PROM and LUE coordination HEP with 25% verbal cues or less for proper execution. Baseline: New to outpt OT Goal status: IN PROGRESS   2.  Pt will be independent with splint wear and care for RUE daytime/nighttime with appropriate options available Baseline: custom hand splint at home - increased use lately but not consistent previously Goal status: IN PROGRESS    3.  Pt will recall the 5 main sensory precautions (cold, heat, sharp/breakable, chemical, and heavy) as needed to prevent injury/harm secondary to impairments.  Baseline: New to outpt OT  Goal status: IN PROGRESS   4. Pt will verbalize understanding of adapted strategies and/or equipment PRN to increase safety and independence with ADLs and IADLs.             Baseline: New to outpt OT             Goal Status: MET - pt reports she is doing fine with ADLs   LONG TERM GOALS: Target date: 11/28/23   Patient will demonstrate updated BUE HEP with visual handouts only for proper execution - RUE stretching and LUE coordination. Baseline: New  to outpt OT  Goal status: IN Progress    2.  Pt will independently recall at least 3 ways to manage spasticity in RUE as needed  to prevent pain and maximize overall function.   Baseline: New to outpt OT  Goal Status: MET  3. Pt will verbalize understanding of community resources to assist with future planning for independent living - housing, work, AE, transportation etc.. Baseline: New to outpt OT s/p 2 years Goal status: MET - pt satisfied with community    4. Pt will be aware/use sensory stimulation activities to promote max sensory awareness of R hand for engaging hand in bimanual tasks.  Baseline: unable to identify 5/5 RUE fingers touched with vision occluded Goal status: IN Progress   ASSESSMENT:  CLINICAL IMPRESSION: Patient demonstrates good tolerance to new splint and able to apply it with extra time.  May need further training but will follow up in future visits. Will continue to progress towards goals.   PERFORMANCE DEFICITS: in functional skills including ADLs, IADLs, coordination, dexterity, sensation, tone, ROM, strength, fascial restrictions, muscle spasms, flexibility, Fine motor control, Gross motor control, mobility, balance, endurance, decreased knowledge of precautions, decreased knowledge of use of DME, wound, skin integrity, and UE functional use, cognitive skills including problem solving, and psychosocial skills including coping strategies, environmental adaptation, and routines and behaviors.   IMPAIRMENTS: are limiting patient from ADLs, IADLs, rest and sleep, education, work, leisure, and social participation.   CO-MORBIDITIES: may have co-morbidities  that affects occupational performance. Patient will benefit from skilled OT to address above impairments and improve overall function.  REHAB POTENTIAL: Good  PLAN:  OT FREQUENCY: 1-2x/week  OT DURATION: 8 weeks  PLANNED INTERVENTIONS: 97168 OT Re-evaluation, 97535 self care/ADL training, 02889 therapeutic  exercise, 97530 therapeutic activity, 97112 neuromuscular re-education, 97140 manual therapy, 97035 ultrasound, [id number] electrical stimulation (manual), 97760 Orthotic Initial, [id number] Orthotic/Prosthetic subsequent, passive range of motion, balance training, functional mobility training, psychosocial skills training, energy conservation, coping strategies training, patient/family education, and DME and/or AE instructions  RECOMMENDED OTHER SERVICES: NA - PT eval completed on same date  CONSULTED AND AGREED WITH PLAN OF CARE: Patient and family member/caregiver  PLAN FOR NEXT SESSIONS: review sensory precautions and update goal  Check splints - RMI delivered 8/4 NMES and/or neuro glove options - review PRN Putty HEP for LUE with RUE stabilizing  Has pt followed up with? Community resources (provided [employer] info at eval) R UE thumb position to promote grasp/stabilization with R hand AE considerations   Clarita LITTIE Pride, OT 10/06/2023, 6:42 PM

## 2023-10-08 ENCOUNTER — Ambulatory Visit: Admitting: Occupational Therapy

## 2023-10-08 ENCOUNTER — Ambulatory Visit

## 2023-10-08 DIAGNOSIS — R278 Other lack of coordination: Secondary | ICD-10-CM

## 2023-10-08 DIAGNOSIS — M6281 Muscle weakness (generalized): Secondary | ICD-10-CM

## 2023-10-08 DIAGNOSIS — M25631 Stiffness of right wrist, not elsewhere classified: Secondary | ICD-10-CM

## 2023-10-08 DIAGNOSIS — I69151 Hemiplegia and hemiparesis following nontraumatic intracerebral hemorrhage affecting right dominant side: Secondary | ICD-10-CM

## 2023-10-08 DIAGNOSIS — R208 Other disturbances of skin sensation: Secondary | ICD-10-CM

## 2023-10-08 DIAGNOSIS — R2681 Unsteadiness on feet: Secondary | ICD-10-CM

## 2023-10-08 NOTE — Therapy (Signed)
 OUTPATIENT PHYSICAL THERAPY NEURO TREATMENT   Patient Name: Savannah Virginia  [name] MRN: [id number] DOB:09/04/2000, 23 y.o., female Today's Date: 10/08/2023   PCP: Alm Bilis, MD REFERRING PROVIDER: Charolet Yvonna SQUIBB, PA-C  END OF SESSION:  PT End of Session - 10/08/23 1147     Visit Number 7    Number of Visits 13    Date for PT Re-Evaluation 11/14/23    Authorization Type Aetna Medicare/ medicaid    PT Start Time 1146    PT Stop Time 1230    PT Time Calculation (min) 44 min    Equipment Utilized During Treatment Gait belt;Other (comment)   c-brace   Activity Tolerance Patient tolerated treatment well    Behavior During Therapy [facility] for tasks assessed/performed          Past Medical History:  Diagnosis Date   Asthma    Stroke [facility])    Past Surgical History:  Procedure Laterality Date   BRAIN SURGERY  02/20/2016   Patient Active Problem List   Diagnosis Date Noted   Mid back pain on left side 07/17/2022   Sleep difficulties 07/17/2022   MDD (major depressive disorder), recurrent severe, without psychosis (HCC) 08/04/2019   Suicide ideation 08/04/2019   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 (HCC) 02/26/2018   Adjustment disorder 02/26/2018   Spasticity 04/15/2017   Impaired functional mobility, balance, gait, and endurance 07/08/2016   AVM (arteriovenous malformation) brain 05/27/2016   Expressive aphasia 03/26/2016   Hydrocephalus, acquired (HCC) 03/07/2016   Neurocognitive deficits 03/07/2016   Intraparenchymal hematoma of brain (HCC) 02/20/2016   Hypertension 02/20/2016   Bradycardia 02/20/2016   Midline shift of brain due to hematoma (HCC) 02/20/2016    ONSET DATE: 08/28/23  REFERRING DIAG: P30.848 (ICD-10-CM) - Hemiplegia and hemiparesis following nontraumatic intracerebral hemorrhage affecting right dominant side   THERAPY DIAG:  Muscle weakness (generalized)  Other lack of  coordination  Unsteadiness on feet  Rationale for Evaluation and Treatment: Rehabilitation  SUBJECTIVE:                                                                                                                                                                                             SUBJECTIVE STATEMENT: Patient arrives to clinic with grandmother. Got stretch strap to try. Still has heel wedge out.  Pt accompanied by: self  PERTINENT HISTORY: asthma, CVA  PAIN:  Are you having pain? No  PRECAUTIONS: Fall  PATIENT GOALS: to learn how to walk in her new brace, stairs, ramps, floor transfers, stumble recovery  TREATMENT Theract: -trial of plantar fasciitis brace -discussion of need for arch support additions vs built in to brace   -seated march over with green theraband  -resisted R knee drive in standing   Gait: -total of 8 mins on treadmill, 1.0-1.47mph and up to 4% incline   -PT facilitating neutral pelvic rotation, minimizing R posterior rotation with compensatory hip hike  PATIENT EDUCATION: Education details: continue HEP Person educated: Patient and Parent Education method: Medical illustrator Education comprehension: verbalized understanding and needs further education  HOME EXERCISE PROGRAM: Access Code: [email or web address] URL: [email or web address] Date: 09/22/2023 Prepared by: Delon Pop  Exercises - Seated March With Resistance at Feet  - 1 x daily - 7 x weekly - 3 sets - 10 reps  -donning/doffing c-brace in sitting vs long sitting on bed   GOALS: Goals reviewed with patient? Yes  SHORT TERM GOALS: Target date: 10/17/23  Pt will be independent with initial HEP for improved proximal strength and balance  Baseline: to be provided  Goal status: INITIAL  2.  Pt will improve 5x STS to </= 20 sec to demo  improved functional LE strength and balance   Baseline: 26s + L UE Goal status: INITIAL  3.  Pt will improve gait speed to >/= .8/s to demonstrate improved community ambulation  Baseline: .68m/s Goal status: INITIAL  4.  Patient will negotiate at least 12 steps with reciprocal pattern, HR use as necessary and no more than CGA Baseline: CGA 8 steps Goal status: INITIAL    LONG TERM GOALS: Target date: 11/14/23  Pt will be independent with final HEP for improved proximal strength and gait mechanics  Baseline: to be provided Goal status: INITIAL  2.  Pt will improve 5x STS to </= 14 sec to demo improved functional LE strength and balance   Baseline: 26s with L UE Goal status: INITIAL  3.  Pt will improve gait speed to >/= .37m/s to demonstrate improved community ambulation  Baseline: .65m/s Goal status: INITIAL  4.  Patient will negotiate at least 12 steps, reciprocal pattern and HR use as necessary ModI  Baseline: CGA 8 steps Goal status: INITIAL  5.  Patient will both ascend and descend a ramp with LRAD, ModI and equal step lengths B  Baseline: SBA + shortened L step length  Goal status: INITIAL  6.  Patient will ambulate >/=1532ft during to demonstrate improved community ambulation endurance Baseline: 1134ft with no AD + CGA and C-brace Goal status: REVISED  7. Patient will be able to complete floor > stand transfer with no more than CGA and no UE support to simulate a fall in the community and recovery   Baseline: MinA + L UE support on mat  Goal status: REVISED  ASSESSMENT:  CLINICAL IMPRESSION: Patient seen for skilled PT session with emphasis on continued practice with C- Brace and R hip flexion strength. With fatigue, patient develops R posterior pelvic rotation and hip hike. Limited endurance of hip flexors noted, especially up inclines. She continues to benefit from continued skilled interventions to improve this. Continue POC.    OBJECTIVE  IMPAIRMENTS: Abnormal gait, decreased balance, decreased endurance, decreased knowledge of use of DME, difficulty walking, decreased strength, impaired UE functional use, and pain.   ACTIVITY LIMITATIONS: carrying, bending, squatting, stairs, locomotion level, and caring for others  PARTICIPATION LIMITATIONS: meal prep, cleaning, interpersonal relationship, driving, shopping, community activity, occupation, and school  PERSONAL FACTORS: Age, Past/current experiences, Profession, Social background, Time since onset of injury/illness/exacerbation, and Transportation  are also affecting patient's functional outcome.   REHAB POTENTIAL: Fair time since onset  CLINICAL DECISION MAKING: Stable/uncomplicated  EVALUATION COMPLEXITY: Low  PLAN:  PT FREQUENCY: 1-2x/week  PT DURATION: 8 weeks  PLANNED INTERVENTIONS: 97164- PT Re-evaluation, 97750- Physical Performance Testing, 97110-Therapeutic exercises, 97530- Therapeutic activity, [id number]- Neuromuscular re-education, 97535- Self Care, 02859- Manual therapy, [id number]- Gait training, [phone]- Orthotic Initial, [phone]- Orthotic/Prosthetic subsequent, [phone]- Aquatic Therapy, [phone]- Electrical stimulation (manual), Patient/Family education, Balance training, Stair training, Vestibular training, Visual/preceptual remediation/compensation, and DME instructions  PLAN FOR NEXT SESSION: tall kneel-> half kneel transition, balance in those positions, blaze pods? Ultimately working toward floor transfer with no UE support, HEP, proximal LE strengthening, ramp, stairs, STG   Delon DELENA Pop, PT Delon DELENA Pop, PT, DPT, CBIS  10/08/2023, 1:01 PM

## 2023-10-08 NOTE — Therapy (Unsigned)
 OUTPATIENT OCCUPATIONAL THERAPY NEURO TREATMENT  Patient Name: Savannah [name]  Turpen MRN: [id number] DOB:06/02/2000, 23 y.o., female Today's Date: 10/08/2023  PCP: Pura Lenis, MD REFERRING PROVIDER: Waddell Corean HERO, MD  END OF SESSION:  OT End of Session - 10/08/23 1218     Visit Number 6    Number of Visits 13    Date for OT Re-Evaluation 11/14/23    Authorization Type [employer] [id number]/ [employer] covered @ 100%    OT Start Time 1230    OT Stop Time 1315    OT Time Calculation (min) 45 min    Activity Tolerance Patient tolerated treatment well    Behavior During Therapy WFL for tasks assessed/performed          Past Medical History:  Diagnosis Date   Asthma    Stroke [facility])    Past Surgical History:  Procedure Laterality Date   BRAIN SURGERY  02/20/2016   Patient Active Problem List   Diagnosis Date Noted   Mid back pain on left side 07/17/2022   Sleep difficulties 07/17/2022   MDD (major depressive disorder), recurrent severe, without psychosis (HCC) 08/04/2019   Suicide ideation 08/04/2019   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 (HCC) 02/26/2018   Adjustment disorder 02/26/2018   Spasticity 04/15/2017   Impaired functional mobility, balance, gait, and endurance 07/08/2016   AVM (arteriovenous malformation) brain 05/27/2016   Expressive aphasia 03/26/2016   Hydrocephalus, acquired (HCC) 03/07/2016   Neurocognitive deficits 03/07/2016   Intraparenchymal hematoma of brain (HCC) 02/20/2016   Hypertension 02/20/2016   Bradycardia 02/20/2016   Midline shift of brain due to hematoma (HCC) 02/20/2016    ONSET DATE: 04/07/2023  REFERRING DIAG: P30.848 (ICD-10-CM) - 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  On the first night it slid a bit but he tightened the 2nd night and it stayed in place well. Pt continues to report that she likes wearing the neoprene brace which she had on today, to help with her R thumb pain.   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 Emeline for Botox  going well. Trying to get better with bracing overnight. Still not doing as often as she would like, not really doing it all. Better when she was on break from school. Hard when she gets back to her dorm late at night, class goes until 9 pm.   PRECAUTIONS: Fall  WEIGHT BEARING RESTRICTIONS: No  PAIN:  Are you having pain? Not really but she can't always feel where the hand is, she gets weird feelings/aches throughout the arm   FALLS: Has patient fallen in last 6 months? Yes. Number of falls 1 when she was in a boot on L leg for a sprain x 2 months with AFO on R foot.  LIVING ENVIRONMENT: Lives with: lives with their family and grandma but  will be moving back into the dorms at [employer] in a month - has handicap accessible bathroom Lives in: House/apartment Stairs: Yes: External: 7 steps; bilateral Has following equipment at home: shower chair, Grab bars, and walk in showers  Pt has 2 years remaining in her degree ie) studying studio art - concentration in painting  PLOF: Independent with basic ADLs  PATIENT GOALS: Pt able to  report PT goals but none specific to OT although MR review reveals she was interested in OT to work on skills related to dorm life and PT for when brace comes in.  She was interested in exploring appropriate splinting and community resources r/t driving etc.   OBJECTIVE:  Note: Objective measures were completed at Evaluation unless otherwise noted.  HAND DOMINANCE: Previously R handed, now Left  ADLs: Overall ADLs: Pt generally independent with LUE Transfers/ambulation related to ADLs: Mod I with AE Eating: uses fork or knife in L hand Grooming: just brushes hair; gets help to dye her hair UB Dressing: hooks her bra beforehand, can do buttons with L hand LB Dressing: struggling with shoe due to new brace - getting new 'wider' pair of shoes  Toileting: Mod Ind Bathing: Mod Ind Tub Shower transfers: Mod Ind with walk in shower Equipment: Shower seat without back, Grab bars, and Walk in shower  IADLs: Shopping: [employer] - grandmother or friends transport her  Light housekeeping: Mod Ind at dorm Meal Prep: TBA Community mobility: Mod Ind Medication management: Mod Ind at [employer] management: TBA Handwriting: TBA as pt was born R handed and had to transition to LUE s/p ICH Classes are mostly online - she speaks into phone with a google doc to complete most work  MOBILITY STATUS: Independent and Hx of falls  POSTURE COMMENTS:  rounded shoulders and forward head, R hemiplegic UE  Sitting balance: WFL seated in arm chair  ACTIVITY TOLERANCE: Activity tolerance: Good - walks around campus to get to/from classes  FUNCTIONAL OUTCOME MEASURES: TBA: PSFS  UPPER EXTREMITY ROM:    Active ROM Right eval Left eval  Shoulder flexion <90* WNL  Shoulder abduction    Shoulder adduction    Shoulder extension    Shoulder internal rotation    Shoulder external rotation    Elbow flexion Limited   Elbow extension Not full   Wrist flexion    Wrist extension Poor - barely  neutral PROM   Wrist ulnar deviation Poor   Wrist radial deviation Poor   Wrist pronation WFL   Wrist supination Poor   (Blank rows = not tested)  UPPER EXTREMITY MMT:     RUE - 2-/5 LUE - 4-5/5  HAND FUNCTION: Grip strength: Right: unable to perform with dynamometer lbs; Left: TBA lbs  COORDINATION: Pt unable to pick up object with R hand unless fully placed in her hand and poor ability to release object  SENSATION: Light touch: Impaired  and unable to identify when OTR touch any of her fingers although she said she can tell when she is touching herself.   EDEMA: NA  MUSCLE TONE: RUE: Moderate  COGNITION: Overall cognitive status: Within functional limits for tasks assessed  VISION: Subjective report: has glasses - wears them to read or do school work - just finished 1 summer class and starting and new one Baseline vision: Wears glasses for reading only Visual history: NA  VISION ASSESSMENT: Not tested  Patient has difficulty with following activities due to following visual impairments: NA  OBSERVATIONS: Pt ambulates with  no AD but use of RLE C-brace. No loss of balance. The pt is well kept and has RLE TKFO brace applied donned.                                                                                                                           TODAY'S TREATMENT:    - Self Care education and training completed for duration as noted below including:  Therapist continues to review goals with patient and updated patient progression, provide ideas re: AE considerations for ADLs/IADLS ie) buttons/zippers and other home management options for cooking - however grandfather currently does all this limiting her engagement.  No additional functional limitations identified. Pt independent with recall of 5 main sensory precautions. - Therapeutic activities completed for duration as noted below including: Engagement of R UE for grasp/release (more like relax) motions.  Pt has  extreme difficulty with opening fingers and must place objects inside her digits.  Pt engaged in grasp of smaller blocks, narrower cones and engaged in managing spasticity to hold items and try to relax enough to release them.  PATIENT EDUCATION: Education details: goal progression; sensory precautions; spasticity management Person educated: Patient and grandmother Education method: Explanation, Verbal cues, and Handouts Education comprehension: verbalized understanding, verbal cues required, and needs further education  HOME EXERCISE PROGRAM: 09/30/2023: Dowel HEP Access Code: [email or web address] 10/03/2023: sensory precautions; spasticity management  GOALS: Goals reviewed with patient? Yes  SHORT TERM GOALS: Target date: 10/24/23  Patient will demonstrate initial R UE PROM and LUE coordination HEP with 25% verbal cues or less for proper execution. Baseline: New to outpt OT Goal status: MET   2.  Pt will be independent with splint wear and care for RUE daytime/nighttime with appropriate options available Baseline: custom hand splint at home - increased use lately but not consistent previously Goal status: IN PROGRESS    3.  Pt will recall the 5 main sensory precautions (cold, heat, sharp/breakable, chemical, and heavy) as needed to prevent injury/harm secondary to impairments.  Baseline: New to outpt OT  Goal status: MET   4. Pt will verbalize understanding of adapted strategies and/or equipment PRN to increase safety and independence with ADLs and IADLs.             Baseline: New to outpt OT             Goal Status: MET - pt reports she is doing fine with ADLs   LONG TERM GOALS: Target date: 11/28/23   Patient will demonstrate updated BUE HEP with visual handouts only for proper execution - RUE stretching and LUE coordination. Baseline: New to outpt OT  Goal status: IN Progress    2.  Pt will independently recall at least 3 ways to manage spasticity in RUE as needed to prevent pain and  maximize overall function.   Baseline: New to outpt OT  Goal Status: MET  3. Pt will verbalize understanding of community resources to assist with future planning  for independent living - housing, work, AE, Economist.. Baseline: New to outpt OT s/p 2 years Goal status: MET - pt satisfied with community    4. Pt will be aware/use sensory stimulation activities to promote max sensory awareness of R hand for engaging hand in bimanual tasks.  Baseline: unable to identify 5/5 RUE fingers touched with vision occluded Goal status: IN Progress   ASSESSMENT:  CLINICAL IMPRESSION: Patient demonstrates good tolerance to therapy this session again today and good understanding of spasticity management, splint use, AE options and is encouraged to use R UE for stabilizing objects for manipulation with LUE. Will continue to progress towards goals s/p return to school and update POC or DC.   PERFORMANCE DEFICITS: in functional skills including ADLs, IADLs, coordination, dexterity, sensation, tone, ROM, strength, fascial restrictions, muscle spasms, flexibility, Fine motor control, Gross motor control, mobility, balance, endurance, decreased knowledge of precautions, decreased knowledge of use of DME, wound, skin integrity, and UE functional use, cognitive skills including problem solving, and psychosocial skills including coping strategies, environmental adaptation, and routines and behaviors.   IMPAIRMENTS: are limiting patient from ADLs, IADLs, rest and sleep, education, work, leisure, and social participation.   CO-MORBIDITIES: may have co-morbidities  that affects occupational performance. Patient will benefit from skilled OT to address above impairments and improve overall function.  REHAB POTENTIAL: Good  PLAN:  OT FREQUENCY: 1-2x/week  OT DURATION: 8 weeks  PLANNED INTERVENTIONS: 97168 OT Re-evaluation, 97535 self care/ADL training, 02889 therapeutic exercise, 97530 therapeutic  activity, 97112 neuromuscular re-education, 97140 manual therapy, 97035 ultrasound, [id number] electrical stimulation (manual), 97760 Orthotic Initial, [id number] Orthotic/Prosthetic subsequent, passive range of motion, balance training, functional mobility training, psychosocial skills training, energy conservation, coping strategies training, patient/family education, and DME and/or AE instructions  RECOMMENDED OTHER SERVICES: NA - PT eval completed on same date  CONSULTED AND AGREED WITH PLAN OF CARE: Patient and family member/caregiver  PLAN FOR NEXT SESSIONS:   Check splints  NMES and/or neuro glove options - review PRN Putty HEP for LUE with RUE stabilizing  Has pt followed up with? Community resources (provided [employer] at eval) AE considerations  Next visit ~ 1 month out and will be after she has returned to school - UPOC or DC.   Clarita LITTIE Pride, OT 10/08/2023, 7:42 PM

## 2023-10-31 ENCOUNTER — Ambulatory Visit

## 2023-11-07 ENCOUNTER — Ambulatory Visit

## 2023-11-07 ENCOUNTER — Ambulatory Visit: Admitting: Occupational Therapy

## 2023-11-10 ENCOUNTER — Encounter: Admitting: Physical Medicine and Rehabilitation

## 2023-11-14 ENCOUNTER — Encounter: Payer: Self-pay | Admitting: Physical Therapy

## 2023-11-14 ENCOUNTER — Ambulatory Visit: Attending: Physician Assistant | Admitting: Occupational Therapy

## 2023-11-14 ENCOUNTER — Ambulatory Visit: Admitting: Physical Therapy

## 2023-11-14 DIAGNOSIS — I69151 Hemiplegia and hemiparesis following nontraumatic intracerebral hemorrhage affecting right dominant side: Secondary | ICD-10-CM | POA: Insufficient documentation

## 2023-11-14 DIAGNOSIS — R208 Other disturbances of skin sensation: Secondary | ICD-10-CM | POA: Insufficient documentation

## 2023-11-14 DIAGNOSIS — R29818 Other symptoms and signs involving the nervous system: Secondary | ICD-10-CM | POA: Diagnosis present

## 2023-11-14 DIAGNOSIS — R278 Other lack of coordination: Secondary | ICD-10-CM | POA: Diagnosis present

## 2023-11-14 DIAGNOSIS — M25611 Stiffness of right shoulder, not elsewhere classified: Secondary | ICD-10-CM | POA: Diagnosis present

## 2023-11-14 DIAGNOSIS — M6281 Muscle weakness (generalized): Secondary | ICD-10-CM | POA: Diagnosis present

## 2023-11-14 DIAGNOSIS — R2681 Unsteadiness on feet: Secondary | ICD-10-CM | POA: Insufficient documentation

## 2023-11-14 DIAGNOSIS — M25631 Stiffness of right wrist, not elsewhere classified: Secondary | ICD-10-CM | POA: Insufficient documentation

## 2023-11-14 NOTE — Therapy (Signed)
 OUTPATIENT OCCUPATIONAL THERAPY NEURO TREATMENT and DISCHARGE  Patient Name: Savannah Virginia  [name] MRN: [id number] DOB:December 31, 2000, 23 y.o., female Today's Date: 11/14/2023  OCCUPATIONAL THERAPY DISCHARGE SUMMARY  Visits from Start of Care: 7  Current functional level related to goals / functional outcomes: Patient has met 4/4 short-term goals and 2/4 long-term goals to date.   Remaining deficits: Pt remains impacted by RUE spasticity but has strategies to minimize affects on occupational performance   Education / Equipment: Continue with HEP and strategies following OT d/c to maximize function, manage spasticity, and promote joint integrity.    Patient agrees to discharge. Patient goals were partially met. Patient is being discharged due to being pleased with the current functional level.SABRA  PCP: Pura Lenis, MD REFERRING PROVIDER: Waddell Corean HERO, MD  END OF SESSION:  OT End of Session - 11/14/23 1142     Visit Number 7    Number of Visits 13    Date for OT Re-Evaluation 11/14/23    Authorization Type [employer] MC 2025/ [employer] covered @ 100%    OT Start Time 1150    OT Stop Time 1228    OT Time Calculation (min) 38 min    Activity Tolerance Patient tolerated treatment well    Behavior During Therapy WFL for tasks assessed/performed          Past Medical History:  Diagnosis Date   Asthma    Stroke [facility])    Past Surgical History:  Procedure Laterality Date   BRAIN SURGERY  02/20/2016   Patient Active Problem List   Diagnosis Date Noted   Mid back pain on left side 07/17/2022   Sleep difficulties 07/17/2022   MDD (major depressive disorder), recurrent severe, without psychosis (HCC) 08/04/2019   Suicide ideation 08/04/2019   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 (HCC) 02/26/2018   Adjustment disorder 02/26/2018   Spasticity 04/15/2017   Impaired functional mobility, balance, gait, and endurance  07/08/2016   AVM (arteriovenous malformation) brain 05/27/2016   Expressive aphasia 03/26/2016   Hydrocephalus, acquired (HCC) 03/07/2016   Neurocognitive deficits 03/07/2016   Intraparenchymal hematoma of brain (HCC) 02/20/2016   Hypertension 02/20/2016   Bradycardia 02/20/2016   Midline shift of brain due to hematoma (HCC) 02/20/2016    ONSET DATE: 04/07/2023  REFERRING DIAG: P30.848 (ICD-10-CM) - 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 She has only brought her braces to her dorm. No concerns with OT d/c this date.   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 Emeline for Botox  going well. Trying to get better with bracing overnight. Still not doing as often as she would like, not really doing it all. Better when she was on break from school. Hard when she gets  back to her dorm late at night, class goes until 9 pm.   PRECAUTIONS: Fall  WEIGHT BEARING RESTRICTIONS: No  PAIN:  Are you having pain? No  FALLS: Has patient fallen in last 6 months? Yes. Number of falls 1 when  she was in a boot on L leg for a sprain x 2 months with AFO on R foot.  LIVING ENVIRONMENT: Lives with: lives with their family and grandma but will be moving back into the dorms at [employer] in a month - has handicap accessible bathroom Lives in: House/apartment Stairs: Yes: External: 7 steps; bilateral Has following equipment at home: shower chair, Grab bars, and walk in showers  Pt has 2 years remaining in her degree ie) studying studio art - concentration in painting  PLOF: Independent with basic ADLs  PATIENT GOALS: Pt able to report PT goals but none specific to OT although MR review reveals she was interested in OT to work on skills related to dorm life and PT for when brace comes in.  She was interested in exploring appropriate splinting and community resources r/t driving etc.   OBJECTIVE:  Note: Objective measures were completed at Evaluation unless otherwise noted.  HAND DOMINANCE: Previously R handed, now Left  ADLs: Overall ADLs: Pt generally independent with LUE Transfers/ambulation related to ADLs: Mod I with AE Eating: uses fork or knife in L hand Grooming: just brushes hair; gets help to dye her hair UB Dressing: hooks her bra beforehand, can do buttons with L hand LB Dressing: struggling with shoe due to new brace - getting new 'wider' pair of shoes  Toileting: Mod Ind Bathing: Mod Ind Tub Shower transfers: Mod Ind with walk in shower Equipment: Shower seat without back, Grab bars, and Walk in shower  IADLs: Shopping: [employer] - grandmother or friends transport her  Light housekeeping: Mod Ind at dorm Meal Prep: TBA Community mobility: Mod Ind Medication management: Mod Ind at [employer] management: TBA Handwriting: TBA as pt was born R handed and had to transition to LUE s/p ICH Classes are mostly online - she speaks into phone with a google doc to complete most work  MOBILITY STATUS: Independent and Hx of falls  POSTURE COMMENTS:  rounded shoulders  and forward head, R hemiplegic UE  Sitting balance: WFL seated in arm chair  ACTIVITY TOLERANCE: Activity tolerance: Good - walks around campus to get to/from classes  FUNCTIONAL OUTCOME MEASURES: TBA: PSFS  UPPER EXTREMITY ROM:    Active ROM Right eval Left eval  Shoulder flexion <90* WNL  Shoulder abduction    Shoulder adduction    Shoulder extension    Shoulder internal rotation    Shoulder external rotation    Elbow flexion Limited   Elbow extension Not full   Wrist flexion    Wrist extension Poor - barely neutral PROM   Wrist ulnar deviation Poor   Wrist radial deviation Poor   Wrist pronation WFL   Wrist supination Poor   (Blank rows = not tested)  UPPER EXTREMITY MMT:     RUE - 2-/5 LUE - 4-5/5  HAND FUNCTION: Grip strength: Right: unable to perform with dynamometer lbs; Left: TBA lbs  COORDINATION: Pt unable to pick up object with R hand unless fully placed in her hand and poor ability to release object  SENSATION: Light touch: Impaired  and unable to identify when OTR touch any of her fingers although she said she can tell when she is touching herself.   EDEMA: NA  MUSCLE TONE: RUE: Moderate  COGNITION: Overall cognitive status: Within functional limits for tasks assessed  VISION: Subjective report: has glasses - wears them to read or do school work - just finished 1 summer class and starting and new one Baseline vision: Wears glasses for reading only Visual history: NA  VISION ASSESSMENT: Not tested  Patient has difficulty with following activities due to following visual impairments: NA  OBSERVATIONS: Pt ambulates with no AD but use of RLE C-brace. No loss of balance. The pt is well kept and has RLE TKFO brace applied donned.                                                                                                                           TODAY'S TREATMENT:    - Self Care education and training completed for duration as noted below  including:  Therapist reviewed goals with patient and updated patient progression.  No additional functional limitations identified. OT educated pt on use of Dycem with sensory stimulation activities and RUE stretching. Additional options such as stress ball, gel filled ball, spikey ball, hackey sack, etc. also provided.  OT reviewed RUE HEP with pt requiring min cueing for proper completion and assistance for setup.  PATIENT EDUCATION: Education details: goal progression; sensory stimulation; spasticity management; OT d/c Person educated: Patient and grandmother Education method: Explanation, Demonstration, Tactile cues, and Verbal cues Education comprehension: verbalized understanding, returned demonstration, verbal cues required, and tactile cues required  HOME EXERCISE PROGRAM: 09/30/2023: Dowel HEP Access Code: [email or web address] 10/03/2023: sensory precautions; spasticity management  GOALS: Goals reviewed with patient? Yes  SHORT TERM GOALS: Target date: 10/24/23  Patient will demonstrate initial R UE PROM and LUE coordination HEP with 25% verbal cues or less for proper execution. Baseline: New to outpt OT Goal status: MET   2.  Pt will be independent with splint wear and care for RUE daytime/nighttime with appropriate options available Baseline: custom hand splint at home - increased use lately but not consistent previously Goal status: MET    3.  Pt will recall the 5 main sensory precautions (cold, heat, sharp/breakable, chemical, and heavy) as needed to prevent injury/harm secondary to impairments.  Baseline: New to outpt OT  Goal status: MET   4. Pt will verbalize understanding of adapted strategies and/or equipment PRN to increase safety and independence with ADLs and IADLs.             Baseline: New to outpt OT             Goal Status: MET - pt reports she is doing fine with ADLs   LONG TERM GOALS: Target date: 11/28/23   Patient will demonstrate updated BUE HEP with visual  handouts only for proper execution - RUE stretching and LUE coordination. Baseline: New to outpt OT  Goal status: IN Progress    2.  Pt will independently recall at least 3 ways to manage spasticity in RUE as needed to prevent pain and  maximize overall function.   Baseline: New to outpt OT  Goal Status: MET  3. Pt will verbalize understanding of community resources to assist with future planning for independent living - housing, work, AE, transportation etc.. Baseline: New to outpt OT s/p 2 years Goal status: MET - pt satisfied with community    4. Pt will be aware/use sensory stimulation activities to promote max sensory awareness of R hand for engaging hand in bimanual tasks.  Baseline: unable to identify 5/5 RUE fingers touched with vision occluded Goal status: IN Progress   ASSESSMENT:  CLINICAL IMPRESSION: Patient is appropriate for discharge and no longer demonstrates medical necessity for continued skilled occupational therapy services.  PLAN:  OT D/C Completed  CONSULTED AND AGREED WITH PLAN OF CARE: Patient and family member/caregiver  Jocelyn CHRISTELLA Bottom, OT 11/14/2023, 2:35 PM

## 2023-11-14 NOTE — Therapy (Signed)
 OUTPATIENT PHYSICAL THERAPY NEURO TREATMENT - DISCHARGE SUMMARY   Patient Name: Savannah Virginia  [name] MRN: [id number] DOB:Nov 02, 2000, 23 y.o., female Today's Date: 11/14/2023   PCP: Alm Bilis, MD REFERRING PROVIDER: Charolet Yvonna SQUIBB, PA-C  PHYSICAL THERAPY DISCHARGE SUMMARY  Visits from Start of Care: 8  Current functional level related to goals / functional outcomes: See clinical impression statement   Remaining deficits: Relies on C-brace and intermittently cane use for dynamic mobility.  Fall risk.   Education / Equipment: Follow-up with [facility] as needed for C-brace adjustments for steep inclines - techniques discussed to improve this.  Night splint for stretching heel cord.  Walking for endurance.  Returning to regular HEP.  Plan for discharge today with progress towards goals.   Patient agrees to discharge. Patient goals were partially met. Patient is being discharged due to maximized rehab potential.    END OF SESSION:  PT End of Session - 11/14/23 1242     Visit Number 8    Number of Visits 13    Date for PT Re-Evaluation 11/14/23    Authorization Type [employer] Medicare/ medicaid    PT Start Time 1230    PT Stop Time 1314    PT Time Calculation (min) 44 min    Equipment Utilized During Treatment Gait belt;Other (comment)   c-brace   Activity Tolerance Patient tolerated treatment well    Behavior During Therapy [facility] for tasks assessed/performed          Past Medical History:  Diagnosis Date   Asthma    Stroke [facility])    Past Surgical History:  Procedure Laterality Date   BRAIN SURGERY  02/20/2016   Patient Active Problem List   Diagnosis Date Noted   Mid back pain on left side 07/17/2022   Sleep difficulties 07/17/2022   MDD (major depressive disorder), recurrent severe, without psychosis (HCC) 08/04/2019   Suicide ideation 08/04/2019   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 (HCC) 02/26/2018    Adjustment disorder 02/26/2018   Spasticity 04/15/2017   Impaired functional mobility, balance, gait, and endurance 07/08/2016   AVM (arteriovenous malformation) brain 05/27/2016   Expressive aphasia 03/26/2016   Hydrocephalus, acquired (HCC) 03/07/2016   Neurocognitive deficits 03/07/2016   Intraparenchymal hematoma of brain (HCC) 02/20/2016   Hypertension 02/20/2016   Bradycardia 02/20/2016   Midline shift of brain due to hematoma (HCC) 02/20/2016    ONSET DATE: 08/28/23  REFERRING DIAG: P30.848 (ICD-10-CM) - Hemiplegia and hemiparesis following nontraumatic intracerebral hemorrhage affecting right dominant side   THERAPY DIAG:  Muscle weakness (generalized)  Other lack of coordination  Other disturbances of skin sensation  Hemiplegia and hemiparesis following nontraumatic intracerebral hemorrhage affecting right dominant side (HCC)  Other symptoms and signs involving the nervous system  Unsteadiness on feet  Rationale for Evaluation and Treatment: Rehabilitation  SUBJECTIVE:  SUBJECTIVE STATEMENT: Patient arrives to clinic with grandmother. She is not using heel wedge as this created arch pain.  She reports that she has trouble using C-brace on hill at school leading to [address] as her brace locks up.  She has seen [facility] for this and was recommended to make better contact of the heel and toe to the ground at the same time on decline to better load the brace and prevent the lock out.  She has not had a chance to try this though.  She had a single fall a while back without her brace on while getting up to get ready for class, denies acute changes. Pt accompanied by: self  PERTINENT HISTORY: asthma, CVA  PAIN:  Are you having pain? No  PRECAUTIONS: Fall  PATIENT GOALS: to learn how to  walk in her new brace, stairs, ramps, floor transfers, stumble recovery                                                                                                                              TREATMENT -Verbally reviewed HEP - pt reports she has not done these since returning to school and does not go to the gym as it is too far. -Discussed ways to modify going up and down hill (zig zag, laterally, try Delaney's suggested technique to stock automatic knee lockout - revisit [facility] if this does not improve); can use HHA from friend or cane - :  10.31 seconds = 0.97 m/sec OR 3.21 ft/sec -See goal section for further.  PATIENT EDUCATION: Education details: Follow-up with [facility] as needed for C-brace adjustments for steep inclines - techniques discussed to improve this.  Night splint for stretching heel cord.  Walking for endurance.  Returning to regular HEP.  Plan for discharge today with progress towards goals.   Person educated: Patient and Parent Education method: Medical illustrator Education comprehension: verbalized understanding and needs further education  HOME EXERCISE PROGRAM: Access Code: [email or web address] URL: [email or web address] Date: 09/22/2023 Prepared by: Delon Pop  Exercises - Seated March With Resistance at Feet  - 1 x daily - 7 x weekly - 3 sets - 10 reps  -donning/doffing c-brace in sitting vs long sitting on bed   GOALS: Goals reviewed with patient? Yes  SHORT TERM GOALS: Target date: 10/17/23  Pt will be independent with initial HEP for improved proximal strength and balance  Baseline: to be provided  Goal status: INITIAL  2.  Pt will improve 5x STS to </= 20 sec to demo improved functional LE strength and balance   Baseline: 26s + L UE Goal status: INITIAL  3.  Pt will improve gait speed to >/= .8/s to demonstrate improved community ambulation  Baseline: .104m/s Goal status: INITIAL  4.  Patient will negotiate at least 12  steps with reciprocal pattern, HR use as necessary and no more than CGA Baseline: CGA 8 steps Goal status: INITIAL    LONG TERM GOALS: Target  date: 11/14/23  Pt will be independent with final HEP for improved proximal strength and gait mechanics  Baseline: intermittently compliant (9/12) Goal status: PARTIALLY MET  2.  Pt will improve 5x STS to </= 14 sec to demo improved functional LE strength and balance  Baseline: 26s with L UE; 19.97 sec no UE support - pt limited by eccentric timing of C-brace (9/12) Goal status: PARTIALLY MET  3.  Pt will improve gait speed to >/= .76m/s to demonstrate improved community ambulation Baseline: .39m/s; 0.97 m/sec (9/12) Goal status: MET  4.  Patient will negotiate at least 12 steps, reciprocal pattern and HR use as necessary ModI  Baseline: CGA 8 steps; 12 steps reciprocal modI w/ L rail (9/12) Goal status: MET  5.  Patient will both ascend and descend a ramp with LRAD, ModI and equal step lengths B  Baseline: SBA + shortened L step length; IND with improved symmetry of stride (9/12) Goal status: MET  6.  Patient will ambulate >/=1513ft during to demonstrate improved community ambulation endurance Baseline: 1181ft with no AD + CGA and C-brace, 1170 ft no AD + SBA and C-brace (9/12) Goal status: NOT MET  7. Patient will be able to complete floor > stand transfer with no more than CGA and no UE support to simulate a fall in the community and recovery   Baseline: MinA + L UE support on mat - SBA-minA for LE positioning and needed UE support due to C-brace limiting RLE positioning for transfer (9/12)  Goal status: NOT MET  ASSESSMENT:  CLINICAL IMPRESSION: Assessed LTGs today as pt presents back following return to college and recent illness limiting visits.  She reports feeling ready for discharge as she has become more comfortable with her C-brace.  She has an HEP that she has not utilized much at this time, but is working on more  consistent walking across campus.  Her ambulation over level indoor surfaces appears more stable with pt covering similar distance to evaluation.  She requires UE support to rise from floor, but did not need LUE support for 5xSTS this visit but was some limited by eccentric timing of C-brace during transfers.  Her gait speed reflects a much lower fall risk today at 0.97 m/sec w/o AD.  PT will discharge at this time.   OBJECTIVE IMPAIRMENTS: Abnormal gait, decreased balance, decreased endurance, decreased knowledge of use of DME, difficulty walking, decreased strength, impaired UE functional use, and pain.   ACTIVITY LIMITATIONS: carrying, bending, squatting, stairs, locomotion level, and caring for others  PARTICIPATION LIMITATIONS: meal prep, cleaning, interpersonal relationship, driving, shopping, community activity, occupation, and school  PERSONAL FACTORS: Age, Past/current experiences, Profession, Social background, Time since onset of injury/illness/exacerbation, and Transportation are also affecting patient's functional outcome.   REHAB POTENTIAL: Fair time since onset  CLINICAL DECISION MAKING: Stable/uncomplicated  EVALUATION COMPLEXITY: Low  PLAN:  PT FREQUENCY: 1-2x/week  PT DURATION: 8 weeks  PLANNED INTERVENTIONS: 97164- PT Re-evaluation, 97750- Physical Performance Testing, 97110-Therapeutic exercises, 97530- Therapeutic activity, [id number]- Neuromuscular re-education, 97535- Self Care, [id number]- Manual therapy, [id number]- Gait training, [phone]- Orthotic Initial, [phone]- Orthotic/Prosthetic subsequent, [phone]- Aquatic Therapy, [phone]- Electrical stimulation (manual), Patient/Family education, Balance training, Stair training, Vestibular training, Visual/preceptual remediation/compensation, and DME instructions  PLAN FOR NEXT SESSION:  N/A   Daved KATHEE Bull, PT, DPT  11/14/2023, 1:20 PM

## 2023-11-21 ENCOUNTER — Ambulatory Visit: Admitting: Occupational Therapy

## 2023-11-21 ENCOUNTER — Ambulatory Visit

## 2023-11-26 ENCOUNTER — Emergency Department (HOSPITAL_BASED_OUTPATIENT_CLINIC_OR_DEPARTMENT_OTHER)

## 2023-11-26 ENCOUNTER — Other Ambulatory Visit: Payer: Self-pay

## 2023-11-26 ENCOUNTER — Emergency Department (HOSPITAL_BASED_OUTPATIENT_CLINIC_OR_DEPARTMENT_OTHER): Admitting: Radiology

## 2023-11-26 ENCOUNTER — Emergency Department (HOSPITAL_BASED_OUTPATIENT_CLINIC_OR_DEPARTMENT_OTHER)
Admission: EM | Admit: 2023-11-26 | Discharge: 2023-11-26 | Disposition: A | Discharge status: Home / Self Care | Source: Ambulatory Visit | Attending: Emergency Medicine | Admitting: Emergency Medicine

## 2023-11-26 DIAGNOSIS — S99922A Unspecified injury of left foot, initial encounter: Secondary | ICD-10-CM | POA: Diagnosis present

## 2023-11-26 DIAGNOSIS — S93602A Unspecified sprain of left foot, initial encounter: Secondary | ICD-10-CM | POA: Diagnosis not present

## 2023-11-26 DIAGNOSIS — W010XXA Fall on same level from slipping, tripping and stumbling without subsequent striking against object, initial encounter: Secondary | ICD-10-CM | POA: Diagnosis not present

## 2023-11-26 DIAGNOSIS — I10 Essential (primary) hypertension: Secondary | ICD-10-CM | POA: Insufficient documentation

## 2023-11-26 DIAGNOSIS — S0003XA Contusion of scalp, initial encounter: Secondary | ICD-10-CM | POA: Insufficient documentation

## 2023-11-26 NOTE — ED Provider Notes (Signed)
[place] EMERGENCY DEPARTMENT AT [facility] Provider Note   CSN: [id number] Arrival date & time: 11/26/23  1221     Patient presents with: No chief complaint on file.   Savannah Virginia  [name] is a 23 y.o. female.   Pt complains of pain in her left foot after tripping and falling yesterday. Pt was walking on [employer] campus and fell on uneven ground.  Pt was seen at urgent care and had an xray of her foot.  Pt is wearing a cam walker from a previous injury.  Pt was sent here because she experienced a flash of bright light when she fell.  Pt does not think she hit her head.  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  The history is provided by the patient. No language interpreter was used.       Prior to Admission medications   Medication Sig Start Date End Date Taking? Authorizing Provider  acetaminophen  (TYLENOL ) 500 MG tablet Take by mouth.    [provider]  albuterol  (VENTOLIN  HFA) 108 (90 Base) MCG/ACT inhaler Inhale into the lungs. 05/27/17   [provider]  baclofen  (LIORESAL ) 10 MG tablet TAKE 1/2 TABLET (5 MG TOTAL) BY MOUTH 3 (THREE) TIMES DAILY. 03/12/23   Emeline Joesph BROCKS, DO  cyanocobalamin (VITAMIN B12) 1000 MCG tablet Take 1,000 mcg by mouth daily.    [provider]  gabapentin (NEURONTIN) 300 MG capsule SMARTSIG:1 Capsule(s) By Mouth Every Evening 11/30/19   [provider]  hydrOXYzine (VISTARIL) 25 MG capsule Take by mouth. 10/26/21   [provider]  lamoTRIgine (LAMICTAL) 150 MG tablet Take by mouth. 10/26/21   [provider]  meclizine  (ANTIVERT ) 25 MG tablet Take 1 tablet (25 mg total) by mouth 3 (three) times daily as needed for dizziness. 11/15/21   Freddi Hamilton, MD  montelukast (SINGULAIR) 10 MG tablet Take 1 tablet by  mouth daily. 03/12/23   [provider]  norethindrone (MICRONOR) 0.35 MG tablet Take 1 tablet by mouth daily. 10/26/21   [provider]  PSYLLIUM PO Take by mouth. 12/05/21   [provider]    Allergies: Hydrocodone-acetaminophen  and Hydrocodone-acetaminophen     Review of Systems  Musculoskeletal:  Positive for joint swelling.  All other systems reviewed and are negative.   Updated Vital Signs BP 115/64 (BP Location: Right Arm)   Pulse 75   Temp 98.3 F (36.8 C) (Oral)   Resp 18   Ht 5' 3 (1.6 m)   Wt 73.5 kg   LMP 11/11/2023 (Approximate)   SpO2 97%   BMI 28.70 kg/m   Physical Exam Vitals and nursing note reviewed.  Constitutional:      Appearance: She is well-developed.  HENT:     Head: Normocephalic.     Nose: Nose normal.     Mouth/Throat:     Mouth: Mucous membranes are moist.  Cardiovascular:     Rate and Rhythm: Normal rate.  Pulmonary:     Effort: Pulmonary effort is normal.  Abdominal:     General: There is no distension.  Musculoskeletal:        General: Swelling and tenderness present.     Cervical back: Normal range of motion.     Comments: Swollen tender left foot, pain with range of motion neurovascular neurosensory intact  Skin:    General: Skin is warm.  Neurological:  General: No focal deficit present.     Mental Status: She is alert and oriented to person, place, and time.     (all labs ordered are listed, but only abnormal results are displayed) Labs Reviewed - No data to display  EKG: None  Radiology: CT Head Wo Contrast Result Date: 11/26/2023 CLINICAL DATA:  Provided history: Polytrauma, blunt. Additional history provided: Mechanical fall, headache, dizziness, nausea. EXAM: CT HEAD WITHOUT CONTRAST TECHNIQUE: Contiguous axial images were obtained from the base of the skull through the vertex without intravenous contrast. RADIATION DOSE REDUCTION: This exam was performed according to the departmental  dose-optimization program which includes automated exposure control, adjustment of the mA and/or kV according to patient size and/or use of iterative reconstruction technique. COMPARISON:  Brain MRI 11/15/2021. 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 Overlying cranioplasty. Tract within the right frontal lobe from prior ventricular catheter. There is no acute intracranial hemorrhage. No acute demarcated cortical infarct. No extra-axial fluid collection. No evidence of an intracranial mass. No midline shift. Vascular: No hyperdense vessel. Skull: No acute calvarial fracture.  Left-sided cranioplasty. Sinuses/Orbits: No orbital mass or acute orbital finding. No significant paranasal sinus disease at the imaged levels. IMPRESSION: 1. No evidence of an acute intracranial abnormality. 2. 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 Electronically Signed   By: Rockey Childs D.O.   On: 11/26/2023 15:56   DG Foot Complete Left Result Date: 11/26/2023 CLINICAL DATA:  Left foot pain after mechanical fall yesterday. EXAM: LEFT FOOT - COMPLETE 3+ VIEW COMPARISON:  None Available. FINDINGS: There is no evidence of fracture or dislocation. There is no evidence of arthropathy or other focal bone abnormality. Soft tissues are unremarkable. IMPRESSION: Negative. Electronically Signed   By: Elsie Gravely M.D.   On: 11/26/2023 15:52     Procedures   Medications Ordered in the ED - No data to display                                  Medical Decision Making Patient fell yesterday on [employer] campus when she was walking on uneven ground.  Patient complains of pain in her left foot.  Patient reports that she saw a flash of light and had a second of loss of vision when she fell.  Patient did not think  that she hit her head.  Patient reports this was a trip and fall.  Amount and/or Complexity of Data Reviewed Independent Historian:     Details: Patient is here with family who is supportive Radiology: ordered and independent interpretation performed. Decision-making details documented in ED Course.    Details: CT head shows no acute changes chronic encephalomalacia X-ray left foot no fracture  Risk Risk Details: Patient is at her baseline.  She is able to walk with the brace on her foot.  Patient is advised Tylenol  for pain.  Patient is at her neurologic baseline.  Patient discharged in stable condition.        Final diagnoses:  Sprain of left foot, initial encounter  Contusion of scalp, initial encounter    ED Discharge Orders     None      An After Visit Summary was printed and given to the patient.     Eulises Kijowski K, PA-C 11/26/23 1722    Ruthe Cornet, DO  11/26/23 2241  

## 2023-11-26 NOTE — Discharge Instructions (Addendum)
 Return if any problems.

## 2023-11-26 NOTE — ED Triage Notes (Signed)
 Pt caox4 c/o pain in L foot reporting she had mechanical fall yesterday, also c/o headache, dizziness and nausea since falling, but denies hitting head and denies LOC when she fell.

## 2023-11-28 ENCOUNTER — Ambulatory Visit

## 2023-11-28 ENCOUNTER — Encounter: Admitting: Occupational Therapy

## 2023-12-15 ENCOUNTER — Encounter: Attending: Physical Medicine and Rehabilitation | Admitting: Physical Medicine and Rehabilitation

## 2023-12-15 ENCOUNTER — Encounter: Payer: Self-pay | Admitting: Physical Medicine and Rehabilitation

## 2023-12-15 VITALS — BP 105/69 | HR 72 | Ht 63.0 in | Wt 169.0 lb

## 2023-12-15 DIAGNOSIS — R252 Cramp and spasm: Secondary | ICD-10-CM | POA: Diagnosis not present

## 2023-12-15 DIAGNOSIS — I69151 Hemiplegia and hemiparesis following nontraumatic intracerebral hemorrhage affecting right dominant side: Secondary | ICD-10-CM | POA: Insufficient documentation

## 2023-12-15 MED ORDER — SODIUM CHLORIDE (PF) 0.9 % IJ SOLN
5.0000 mL | INTRAMUSCULAR | Status: AC
  Administered 2023-12-15: 5 mL via INTRAVENOUS

## 2023-12-15 MED ORDER — ONABOTULINUMTOXINA 100 UNITS IJ SOLR
500.0000 [IU] | Freq: Once | INTRAMUSCULAR | Status: AC
  Administered 2023-12-15: 500 [IU] via INTRAMUSCULAR

## 2023-12-15 NOTE — Patient Instructions (Addendum)
-   Repeat injections Q3Months  - Repeat injections Q3Months ; we will go up on FDS and FDP next time    - Continue nighttime stretch braces in prevent contractures, wounds, and joint destruction.

## 2023-12-15 NOTE — Progress Notes (Signed)
 Savannah Virginia  [name] is a 23 y.o. 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  They are presenting to PM&R clinic  for botox  injection.   Rolled L ankle again recently; CT head unchanged in ER. Felt to be at baseline. She says she feels she is getting better and just stopped using her cane.     ROS: Denies fevers, chills, N/V, abdominal pain, constipation, diarrhea, SOB, cough, chest pain, new weakness or paraesthesias.       PE: Constitution: Appropriate appearance for age. No apparent distress  Resp: No respiratory distress. No accessory muscle usage. on RA Cardio: Well perfused appearance. No peripheral edema. Abdomen: Nondistended. Nontender.   Psych: Appropriate mood and affect. Neuro: AAOx4. No apparent cognitive deficits. R hemiparesis.  MAS 1+ R elbow extensors, MAS 1 elbow flexors, MAS 3 wrist flexors, MAS 3 finger flexors at   PIP, MAS 2 PIP, ; Mas 3 and MCP; Mas 2 thumb flexor , MAS 3 pronator   MAS 3-4 R plantarflexors with knee flexed and extended   MSK:  Slight flexion contractures of distal finger flexors; PIP subluxations with extension PROM R ankle 0 to -2 degrees with knee bent, -5  degrees with knee extended    Physical exam unchanged from the above on reexamination 12/15/23     Botolulinum Toxin Injection: [x]  BOTOX  (onabotulinumtoxinA ) [_] DYSPORT (abobotulinumtoxinA)   Goals with treatment: [ x] Decrease spasms/ abnormal movements [ x] Improve Active / Passive ROM [x ] Improve ADLs [x ] Improve functional mobility [x ] Improve gait mechanics [x ] Improve positioning/posture [ x] Prevent contracture  [ x] Prevent joint destruction [ ]  Prevent skin breakdown [ ]  Decrease caregiver burden [ x] Improve hygiene [x ] Improve Pain   MEDICATION:  ONAbotulinum toxin. 500 Units    NaCl 5 ccs       CONSENT: Obtained in writing followed by time-out per [facility] policy. Consent uploaded to chart.   Benefits discussed  included, but were not limited to, decreased muscle tightness and spasticity, increased joint range of motion, improved limb positioning and facilitation of hygiene and nursing care.    Risks discussed included, but were not limited to, pain and discomfort, bleeding, bruising, excessive weakness, venous thrombosis, muscle atrophy, and distant spread of toxin which could include generalized muscle weakness, diplopia, blurred vision, ptosis, dysphagia, dysphonia, dysarthria, urinary incontinence and breathing difficulties. These symptoms have been reported hours to weeks after injection. Swallowing and breathing difficulties may be life threatening, and there have been reports of death. Patient/Family member/Guardian/Caregiver have been offered botulinum toxin informational material upon initial consultation and this information has been continually available. All questions answered to patient/family member/guardian/ caregiver satisfaction. They would like to proceed with procedure. There are no noted contraindications to procedure.   PROCEDURE Galerius.Gant ] Without Ultrasound: Patient was placed in a position with the appropriate muscles exposed, located and identified. The skin was cleaned with ChloraPrep and ethyl chloride was sprayed for topical anesthetic. Using combination EMG amplification and electrical stimulation, the following muscles were identified using anatomical landmarks described by Perotto, et al (1994) and injected following aspiration to ensure blood vessels were avoided.   [_ ] With Ultrasound: Patient was placed in a position with the appropriate muscles exposed, located and identified. The skin was cleaned with ChloraPrep and ethyl chloride was sprayed for topical anesthetic. Using combination Ultrasound guidance for anatomical guidance, avoidance of significant vasculature, to decrease the risk of hematoma  formation and ensure botox  was placed in the correct location; EMG amplification and  electrical stimulation, the following muscles were identified and injected following aspiration to ensure blood vessels were avoided. A _linear transducer was used during the procedure. The botulinum toxin was visualized entering the appropriate musculature.    MUSCLE: Units /Sites 250 U RLE Right soleus  100 U - 2 sites Right gastrocnemius 75 U medial / 75 U lateral    250 U RUE Right triceps  75 U - 2 sites--> 40 U Right FDS  75 U --> 50 U --> 70 U Add FDP 40 U  Right FCU 25 U Right FCR 25 U Right FPL  25 U Right PT  25 U     500 units were injected without difficulty. No complications were encountered. The patient tolerated the procedure well. Wasted 0   Plan:   - Repeat injections Q3Months ; we will go up on FDS and FDP next time. Tentative as below: 250 U RLE Right soleus  100 U - 2 sites Right gastrocnemius 75 U medial / 75 U lateral    250 U RUE Right triceps  75 U - 2 sites--> 40 U Right FDS  75 U --> 50 U --> 70 U Right FDP 40 U --> increase to 70 U Right FCU 25 U Right FCR 25 U Right FPL  25 U  Right PT  25 U     - Continue nighttime stretch braces in prevent contractures, wounds, and joint destruction.

## 2024-03-10 ENCOUNTER — Encounter: Payer: Self-pay | Admitting: Physical Medicine and Rehabilitation

## 2024-03-15 ENCOUNTER — Encounter: Attending: Physical Medicine and Rehabilitation | Admitting: Physical Medicine and Rehabilitation

## 2024-03-15 ENCOUNTER — Encounter: Payer: Self-pay | Admitting: Physical Medicine and Rehabilitation

## 2024-03-15 VITALS — BP 113/75 | HR 78 | Ht 63.0 in | Wt 164.0 lb

## 2024-03-15 DIAGNOSIS — G43011 Migraine without aura, intractable, with status migrainosus: Secondary | ICD-10-CM | POA: Insufficient documentation

## 2024-03-15 DIAGNOSIS — I69151 Hemiplegia and hemiparesis following nontraumatic intracerebral hemorrhage affecting right dominant side: Secondary | ICD-10-CM | POA: Insufficient documentation

## 2024-03-15 DIAGNOSIS — R519 Headache, unspecified: Secondary | ICD-10-CM | POA: Insufficient documentation

## 2024-03-15 DIAGNOSIS — R252 Cramp and spasm: Secondary | ICD-10-CM | POA: Diagnosis not present

## 2024-03-15 DIAGNOSIS — Z7409 Other reduced mobility: Secondary | ICD-10-CM | POA: Diagnosis not present

## 2024-03-15 DIAGNOSIS — G43711 Chronic migraine without aura, intractable, with status migrainosus: Secondary | ICD-10-CM | POA: Diagnosis not present

## 2024-03-15 MED ORDER — ONABOTULINUMTOXINA 100 UNITS IJ SOLR
600.0000 [IU] | Freq: Once | INTRAMUSCULAR | Status: AC
  Administered 2024-03-15: 600 [IU] via INTRAMUSCULAR

## 2024-03-15 MED ORDER — SODIUM CHLORIDE (PF) 0.9 % IJ SOLN
6.0000 mL | Freq: Once | INTRAMUSCULAR | Status: AC
  Administered 2024-03-15: 6 mL

## 2024-03-15 NOTE — Patient Instructions (Signed)
 Voltaren  gel to neck and hsoulders up to 4 times daily Tylenol  up to 1000 mg three times daily Drink at least 8 cups of water per day Massage and heat to your neck as needed Neurology referral made for headaches Follow up in 3 months for botox 

## 2024-03-15 NOTE — Progress Notes (Signed)
 Savannah [name] is a 24 y.o. 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  They are presenting to PM&R clinic  for botox  injection.   Patient c/o constant headaches starting in December. OTC tylenol  and aleve didnt improve. They are constant. Bilateral forehead and neck effected, throbbing, 3-6/10 pain with light sensitivity. Improved with laying down and sleeping. No nausea/vomitting. No blurriness or double vision. No radiating symptoms.   She only drinks a cup of coffee in the morning and that is all in terms of fluid intakes. She is sleeping well at nighttime   Only medication change recently was Implanon in August.    ROS: Denies fevers, chills, N/V, abdominal pain, constipation, diarrhea, SOB, cough, chest pain, new weakness or paraesthesias.       PE: Constitution: Appropriate appearance for age. No apparent distress  Resp: No respiratory distress. No accessory muscle usage. on RA Cardio: Well perfused appearance. No peripheral edema. Abdomen: Nondistended. Nontender.   Psych: Appropriate mood and affect. Neuro: AAOx4. No apparent cognitive deficits. R hemiparesis.  MAS 1+ R elbow extensors, MAS 1 elbow flexors, MAS 3 wrist flexors, MAS 3 finger flexors at   PIP, MAS 2 PIP, ; Mas 3 and MCP; Mas 3 thumb flexor , MAS 3 pronator   MAS 3 R plantarflexors with knee flexed and extended   MSK:  Slight flexion contractures of distal finger flexors; PIP subluxations with extension PROM R ankle 0 to -2 degrees with knee bent, -5  degrees with knee extended   Full AROM neck; TTP bilateral cervical paraspinals and trapezius    Botolulinum Toxin Injection: [x]  BOTOX  (onabotulinumtoxinA ) [_] DYSPORT (abobotulinumtoxinA)   Goals with treatment: [ x] Decrease spasms/ abnormal movements [ x] Improve Active / Passive ROM [x ] Improve ADLs [x ] Improve functional mobility [x ] Improve gait mechanics [x ] Improve positioning/posture [ x]  Prevent contracture  [ x] Prevent joint destruction [ ]  Prevent skin breakdown [ ]  Decrease caregiver burden [ x] Improve hygiene [x ] Improve Pain   MEDICATION:  ONAbotulinum toxin. 600 Units    NaCl 6 ccs       CONSENT: Obtained in writing followed by time-out per [facility] policy. Consent uploaded to chart.   Benefits discussed included, but were not limited to, decreased muscle tightness and spasticity, increased joint range of motion, improved limb positioning and facilitation of hygiene and nursing care.    Risks discussed included, but were not limited to, pain and discomfort, bleeding, bruising, excessive weakness, venous thrombosis, muscle atrophy, and distant spread of toxin which could include generalized muscle weakness, diplopia, blurred vision, ptosis, dysphagia, dysphonia, dysarthria, urinary incontinence and breathing difficulties. These symptoms have been reported hours to weeks after injection. Swallowing and breathing difficulties may be life threatening, and there have been reports of death. Patient/Family member/Guardian/Caregiver have been offered botulinum toxin informational material upon initial consultation and this information has been continually available. All questions answered to patient/family member/guardian/ caregiver satisfaction. They would like to proceed with procedure. There are no noted contraindications to procedure.   PROCEDURE GALERIUS.GANT ] Without Ultrasound: Patient was placed in a position with the appropriate muscles exposed, located and identified. The skin was cleaned with ChloraPrep and ethyl chloride was sprayed for topical anesthetic. Using combination EMG amplification and electrical stimulation, the following muscles were identified using anatomical landmarks described by Perotto, et al (1994) and injected following aspiration to ensure blood vessels were avoided.   [_ ]  With Ultrasound: Patient was placed in a position with the appropriate muscles  exposed, located and identified. The skin was cleaned with ChloraPrep and ethyl chloride was sprayed for topical anesthetic. Using combination Ultrasound guidance for anatomical guidance, avoidance of significant vasculature, to decrease the risk of hematoma formation and ensure botox  was placed in the correct location; EMG amplification and electrical stimulation, the following muscles were identified and injected following aspiration to ensure blood vessels were avoided. A _linear transducer was used during the procedure. The botulinum toxin was visualized entering the appropriate musculature.    MUSCLE: Units /Sites 250 U RLE Right soleus  100 U - 2 sites Right gastrocnemius 75 U medial / 75 U lateral    280 U RUE Right triceps  75 U - 2 sites--> 40 U Right FDS  75 U --> 50 U --> 70 U Right FDP 40 U --> increase to 70 U Right FCU 25 U Right FCR 25 U Right FPL  25 U  Right PT  25 U   530 units were injected without difficulty. No complications were encountered. The patient tolerated the procedure well. Wasted 70   Plan:   - Repeat injections Q3Months    - Continue nighttime stretch braces in prevent contractures, wounds, and joint destruction.   Headaches, intractable Voltaren  gel to neck and hsoulders up to 4 times daily Tylenol  up to 1000 mg three times daily Drink at least 8 cups of water per day Massage and heat to your neck as needed  Neurology referral made for headaches given Hx; reviewed recent MRI, no new deficits on exam to warrant new imaging at this time

## 2024-03-25 ENCOUNTER — Ambulatory Visit: Admitting: Neurology

## 2024-03-25 ENCOUNTER — Encounter: Payer: Self-pay | Admitting: Neurology

## 2024-03-25 VITALS — BP 111/74 | HR 78 | Ht 62.0 in | Wt 174.0 lb

## 2024-03-25 DIAGNOSIS — I69359 Hemiplegia and hemiparesis following cerebral infarction affecting unspecified side: Secondary | ICD-10-CM | POA: Diagnosis not present

## 2024-03-25 DIAGNOSIS — G43011 Migraine without aura, intractable, with status migrainosus: Secondary | ICD-10-CM

## 2024-03-25 DIAGNOSIS — G44301 Post-traumatic headache, unspecified, intractable: Secondary | ICD-10-CM

## 2024-03-25 MED ORDER — AMITRIPTYLINE HCL 10 MG PO TABS: 10.0000 mg | ORAL_TABLET | Freq: Every day | ORAL | 5 refills | Status: AC

## 2024-03-25 MED ORDER — ALPRAZOLAM 0.5 MG PO TABS: 0.5000 mg | ORAL_TABLET | Freq: Every evening | ORAL | 0 refills | Status: AC | PRN

## 2024-03-25 MED ORDER — NURTEC 75 MG PO TBDP: ORAL_TABLET | ORAL | 5 refills | Status: AC

## 2024-03-25 NOTE — Patient Instructions (Signed)
 Assessment: Total time for face to face interview and examination, for review of  images and laboratory testing, neurophysiology testing and pre-existing records, including out-of -network , was 45 minutes. 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   Plan:  Treatment plan and additional workup planned after today includes:   1)  prevention of further migraines by night time dose of  10 mg Amitriptyline  , if this fails go to [facility] or [facility] or [facility].   2)  acute treatment by [facility] , this patient cannot use any triptan.   MRI and MRA brain ordered. Premedication xanax  ordered.   PS: Botox  currently used for hemi-spasticity, if her physical rehab MD can use it for migraine, she can go for it.   Rv in 5-6 months with NP or me.   Helpful Websites: www.AmericanHeadacheSociety.org patenthood.ch www.headaches.org tightmarket.nl www.achenet.org

## 2024-03-25 NOTE — Progress Notes (Signed)
 [name]  Headache Patient   Provider:  Dedra Gores, MD  Referring Provider: Emeline Joesph BROCKS, DO Primary Care Physician:  Pura Lenis, MD  Chief Complaint  Patient presents with   Headache    Rm 2 with grandmother  Pt is well, reports she has been having headaches since the end of Nov 2025. Post brain bleed. She is currently having a headache at least once a daily.  Associated sensitivity to light and sound.     HPI:  Savannah [place]  [name] is a 24 y.o. female and seen here on 03/25/2024 upon referral from Dr. Emeline for a Consultation/ Evaluation of headaches:  First onset before the brain bleed 8 years ago, and after the brain hemorrhage these ceased.  Now new onset headaches - photophobia, phonophobia.   No nausea. Loss of appetite,  lives with grandparents who are legal guardians for many years ( since age 54)  - but grandfather died 2 days ago - 04-Apr-2024. 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 Stroke after that. .   Family history of migraine or other headaches: see above   Stereotypical headache course described: headaches arise from the back and radiate to the front, any time of the day, throbbing, not stabbing, not sharp, not related to food or drink.   Triggers known:  she fell on campus , she has  mild hemiparesis since the stroke., right sided weakness.  She fell  and hit  the head frontal but whiplashed, back of the head hurt, lost vision for minutes after the fall  but no LOC> late September 2025.   No evidence of recent infarction, hemorrhage, mass, or abnormal enhancement.Sequelae of prior cerebral hemorrhage. Skull: No acute calvarial fracture.  Left-sided cranioplasty.   Sinuses/Orbits: No orbital mass or acute orbital finding. No significant paranasal sinus disease at the imaged levels.   IMPRESSION: 1. No evidence of an acute intracranial  abnormality. 2. 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    Failed abortive therapies:  aleve, tylenol .  Can't use TRIPTANS>   Preventive therapies: magnesium.    Makes it less severe - still daily headaches.   This patient reports onset of HA over a period of 4-5 months  since fall.    Review of Systems: Out of a complete 14 system review, the patient complains of only the following symptoms, and all other reviewed systems are negative.   [facility] .    Vertigo concussion,    Vision changes.   Hemipresis.   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      IEP in place.     Social Drivers of  Health   Tobacco Use: Low Risk (03/25/2024)   Patient History    Smoking Tobacco Use: Never    Smokeless Tobacco Use: Never    Passive Exposure: Not on file  Financial Resource Strain: Low Risk (07/16/2023)   Received from [facility]   Overall Financial Resource Strain (CARDIA)    Difficulty of Paying Living Expenses: Not hard at all  Food Insecurity: No Food Insecurity (07/16/2023)   Received [facility]   Epic    Within the past 12 months, you worried that your food would run out before you got the money to buy more.: Never true    Within the past 12 months, the food you bought just didn't last and you didn't have money to get  more.: Never true  Transportation Needs: No Transportation Needs (07/16/2023)   Received from [facility] - Transportation    Lack of Transportation (Medical): No    Lack of Transportation (Non-Medical): No  Physical Activity: Inactive (07/16/2023)   Received from [facility]   Exercise Vital Sign    On average, how many days per week do you engage in moderate to strenuous exercise (like a brisk walk)?: 0 days    On average, how many minutes do you engage in exercise at this level?: 60 min  Stress: No Stress Concern Present (07/16/2023)   Received from [facility] - Occupational Stress Questionnaire    Feeling of Stress : Only a little  Social Connections: Moderately Integrated (07/16/2023)   Received from [facility]   Social Network    How would you rate your social network (family, work, friends)?: Adequate participation with social networks  Intimate Partner Violence: Patient Declined (07/16/2023)   Received from [facility]   HITS    Over the last 12 months how often did your partner physically hurt you?: Patient declined    Over the last 12 months how often did your partner insult you or talk down to you?: Patient declined    Over the last 12 months how often did your partner threaten you with physical harm?: Patient declined    Over the last 12 months how often did your partner scream or curse at you?: Patient declined  Depression (PHQ2-9): Low Risk (03/15/2024)   Depression (PHQ2-9)    PHQ-2 Score: 0  Alcohol Screen: Not on file  Housing: Low Risk (07/16/2023)   [facility] from [facility]    In the last 12 months, was there a time when you were not able to pay the mortgage or rent on time?: No    In the past 12 months, how many times have you moved where you were living?: 0    At any time in the past 12 months, were you homeless or living in a shelter (including now)?: No  Utilities: Not At Risk (07/16/2023)    Received from [facility]    Threatened with loss of utilities: No  Health Literacy: Not on file    Family History  Problem Relation Age of Onset   Depression Mother    Bipolar disorder Mother    Depression Father    Bipolar disorder Father    ADD / ADHD Brother    Migraines Neg Hx    Seizures Neg Hx    Anxiety disorder Neg Hx    Schizophrenia Neg Hx    Autism Neg Hx     Past  Medical History:  Diagnosis Date   Asthma    Stroke [facility])     Past Surgical History:  Procedure Laterality Date   BRAIN SURGERY  02/20/2016    Current Outpatient Medications  Medication Sig Dispense Refill   acetaminophen  (TYLENOL ) 500 MG tablet Take by mouth.     albuterol  (VENTOLIN  HFA) 108 (90 Base) MCG/ACT inhaler Inhale into the lungs.     baclofen  (LIORESAL ) 10 MG tablet TAKE 1/2 TABLET (5 MG TOTAL) BY MOUTH 3 (THREE) TIMES DAILY. 135 tablet 3   cyanocobalamin (VITAMIN B12) 1000 MCG tablet Take 1,000 mcg by mouth daily.     gabapentin (NEURONTIN) 300 MG capsule SMARTSIG:1 Capsule(s) By Mouth Every Evening     hydrOXYzine (VISTARIL) 25 MG capsule Take by mouth.     meclizine  (ANTIVERT ) 25 MG tablet Take 1 tablet (25 mg total) by mouth 3 (three) times daily as needed for dizziness. 15 tablet 0   montelukast (SINGULAIR) 10 MG tablet Take 1 tablet by mouth daily.     norethindrone (MICRONOR) 0.35 MG tablet Take 1 tablet by mouth daily.     PSYLLIUM PO Take by mouth.     Current Facility-Administered Medications  Medication Dose Route Frequency Provider Last Rate Last Admin   sodium chloride  (PF) 0.9 % injection 5 mL  5 mL Intravenous See admin instructions    5 mL at 12/15/23 0746    Allergies as of 03/25/2024 - Review Complete 03/25/2024  Allergen Reaction Noted   Hydrocodone-acetaminophen  Diarrhea, Nausea And Vomiting, and Other (See Comments) 06/25/2016   Hydrocodone-acetaminophen  Other (See Comments) 06/25/2016    Vitals: BP 111/74   Pulse 78   Ht 5' 2 (1.575 m)    Wt 174 lb (78.9 kg)   BMI 31.83 kg/m   @No  data found.     @VITALSLAST3  [[id number]]@ Physical exam:  General: The patient is awake, alert and appears not in acute distress.  The patient is well groomed. Head: Normocephalic, atraumatic.  Neck is supple.  Cardiovascular:  Regular rate and palpable peripheral pulse:  Respiratory: clear to auscultation.  Mallampati: , Skin:  With evidence of edema in right ankle and  right had,  Trunk:  leaning to the right , hemiparesis    Neurologic exam : The patient is awake and alert, oriented to place and time.   Memory subjective  described as intact.  There is a normal attention span & concentration ability.  Speech is fluent without  dysarthria, dysphonia or aphasia.  Mood and affect are appropriate.  Cranial nerves: Pupils are equal and briskly reactive to light.  Funduscopic exam : no evidence of pallor or edema.  Extraocular movements  in vertical and horizontal planes intact and without nystagmus.  Visual fields by finger perimetry are intact. Hearing to finger rub intact.   Facial sensation intact to fine touch. Facial motor strength is symmetric and tongue and uvula move midline.  Motor exam:   Hemiparesis, spastic . Grip Strength on left side normal. Cannot open fingers in the right . SABRA  Proximal strength of shoulder muscles and hip flexors was  weaker on the right, .   Assessment: Total time for face to face interview and examination, for review of  images and laboratory testing, neurophysiology testing and pre-existing records, including out-of -network , was 45 minutes. 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   Plan:  Treatment plan and additional workup  planned after today includes:   1)  prevention of further migraines by night time dose of  10 mg Amitriptyline  , if this fails go to [facility] or [facility] or [facility].   2)   acute treatment by [facility] , this patient cannot use any triptan.   MRI and MRA brain ordered. Premedication xanax  ordered.   PS: Botox  currently used for hemi-spasticity, if her physical rehab MD can use it for migraine, she can go for it.   Rv in 5-6 months with NP or me.   Helpful Websites: www.AmericanHeadacheSociety.org patenthood.ch www.headaches.org tightmarket.nl www.achenet.org    The patient's condition requires frequent monitoring and adjustments in the treatment plan, reflecting the ongoing complexity of care.  This provider is the continuing focal point for all needed services for this condition.   Dedra Gores, MD  [name] Board certified by The [employer] of Sleep Medicine and Diplomate of the [employer] of Sleep Medicine. Board certified In Neurology through the ABPN, Fellow of the [employer] of Neu[employer].

## 2024-03-25 NOTE — Addendum Note (Signed)
 Addended by: CHALICE SAUNAS on: 03/25/2024 10:57 AM   Modules accepted: Orders

## 2024-03-26 ENCOUNTER — Ambulatory Visit: Payer: Self-pay | Admitting: Neurology

## 2024-03-26 LAB — BASIC METABOLIC PANEL WITH GFR
BUN/Creatinine Ratio: 12 (ref 9–23)
BUN: 8 mg/dL (ref 6–20)
CO2: 18 mmol/L — ABNORMAL LOW (ref 20–29)
Calcium: 9.4 mg/dL (ref 8.7–10.2)
Chloride: 108 mmol/L — ABNORMAL HIGH (ref 96–106)
Creatinine, Ser: 0.69 mg/dL (ref 0.57–1.00)
Glucose: 81 mg/dL (ref 70–99)
Potassium: 4.5 mmol/L (ref 3.5–5.2)
Sodium: 143 mmol/L (ref 134–144)
eGFR: 127 mL/min/1.73

## 2024-03-30 ENCOUNTER — Telehealth: Payer: Self-pay | Admitting: Neurology

## 2024-03-30 NOTE — Telephone Encounter (Signed)
 Orders sent to [facility] to schedule. [phone]

## 2024-06-16 ENCOUNTER — Encounter: Attending: Physical Medicine and Rehabilitation | Admitting: Physical Medicine and Rehabilitation

## 2024-08-25 ENCOUNTER — Ambulatory Visit: Admitting: Neurology
